# Patient Record
Sex: Female | Born: 1935 | Race: White | Hispanic: No | Smoking: Never smoker
Health system: Southern US, Community
[De-identification: ages and names within clinical notes are randomized; demographics above are authoritative.]

## PROBLEM LIST (undated history)

## (undated) DIAGNOSIS — M8580 Other specified disorders of bone density and structure, unspecified site: Secondary | ICD-10-CM

## (undated) DIAGNOSIS — I10 Essential (primary) hypertension: Secondary | ICD-10-CM

## (undated) DIAGNOSIS — E785 Hyperlipidemia, unspecified: Secondary | ICD-10-CM

## (undated) HISTORY — PX: CATARACT EXTRACTION: SUR2

## (undated) HISTORY — DX: Hyperlipidemia, unspecified: E78.5

## (undated) HISTORY — DX: Other specified disorders of bone density and structure, unspecified site: M85.80

## (undated) HISTORY — DX: Essential (primary) hypertension: I10

---

## 2001-12-04 ENCOUNTER — Encounter: Payer: Self-pay | Admitting: Family Medicine

## 2001-12-04 ENCOUNTER — Encounter: Admission: RE | Admit: 2001-12-04 | Discharge: 2001-12-04 | Payer: Self-pay | Admitting: Family Medicine

## 2003-05-15 ENCOUNTER — Other Ambulatory Visit: Admission: RE | Admit: 2003-05-15 | Discharge: 2003-05-15 | Payer: Self-pay | Admitting: Family Medicine

## 2003-08-17 ENCOUNTER — Encounter (INDEPENDENT_AMBULATORY_CARE_PROVIDER_SITE_OTHER): Payer: Self-pay | Admitting: *Deleted

## 2003-08-17 ENCOUNTER — Ambulatory Visit (HOSPITAL_COMMUNITY): Admission: RE | Admit: 2003-08-17 | Discharge: 2003-08-17 | Payer: Self-pay | Admitting: Obstetrics and Gynecology

## 2004-12-14 ENCOUNTER — Other Ambulatory Visit: Admission: RE | Admit: 2004-12-14 | Discharge: 2004-12-14 | Payer: Self-pay | Admitting: Family Medicine

## 2005-07-18 ENCOUNTER — Ambulatory Visit: Payer: Self-pay | Admitting: Family Medicine

## 2005-07-24 ENCOUNTER — Ambulatory Visit: Payer: Self-pay | Admitting: Family Medicine

## 2005-10-18 ENCOUNTER — Ambulatory Visit: Payer: Self-pay | Admitting: Family Medicine

## 2006-01-31 ENCOUNTER — Encounter (INDEPENDENT_AMBULATORY_CARE_PROVIDER_SITE_OTHER): Payer: Self-pay | Admitting: Family Medicine

## 2006-01-31 ENCOUNTER — Other Ambulatory Visit: Admission: RE | Admit: 2006-01-31 | Discharge: 2006-01-31 | Payer: Self-pay | Admitting: Family Medicine

## 2006-01-31 ENCOUNTER — Ambulatory Visit: Payer: Self-pay | Admitting: Family Medicine

## 2006-01-31 LAB — CONVERTED CEMR LAB
AST: 30 units/L (ref 0–37)
BUN: 18 mg/dL (ref 6–23)
Calcium: 9.6 mg/dL (ref 8.4–10.5)
Chloride: 100 meq/L (ref 96–112)
Creatinine, Ser: 1.1 mg/dL (ref 0.4–1.2)
GFR calc non Af Amer: 52 mL/min
Glucose, Bld: 116 mg/dL — ABNORMAL HIGH (ref 70–99)
MCV: 96.5 fL (ref 78.0–100.0)
Platelets: 257 10*3/uL (ref 150–400)
RBC: 4.59 M/uL (ref 3.87–5.11)
RDW: 12.5 % (ref 11.5–14.6)
Sodium: 139 meq/L (ref 135–145)
TSH: 5.14 microintl units/mL (ref 0.35–5.50)
Total CHOL/HDL Ratio: 2.5
VLDL: 46 mg/dL — ABNORMAL HIGH (ref 0–40)

## 2006-02-15 ENCOUNTER — Encounter: Admission: RE | Admit: 2006-02-15 | Discharge: 2006-02-15 | Payer: Self-pay | Admitting: Family Medicine

## 2006-02-15 ENCOUNTER — Encounter: Payer: Self-pay | Admitting: Family Medicine

## 2006-03-01 ENCOUNTER — Encounter: Admission: RE | Admit: 2006-03-01 | Discharge: 2006-03-01 | Payer: Self-pay | Admitting: Family Medicine

## 2006-05-02 ENCOUNTER — Ambulatory Visit: Payer: Self-pay | Admitting: Family Medicine

## 2006-05-09 ENCOUNTER — Ambulatory Visit: Payer: Self-pay | Admitting: Family Medicine

## 2006-05-09 LAB — CONVERTED CEMR LAB
ALT: 20 units/L (ref 0–40)
AST: 27 units/L (ref 0–37)
BUN: 27 mg/dL — ABNORMAL HIGH (ref 6–23)
CO2: 28 meq/L (ref 19–32)
Cholesterol: 147 mg/dL (ref 0–200)
Hgb A1c MFr Bld: 6 % (ref 4.6–6.0)
Potassium: 4.1 meq/L (ref 3.5–5.1)
Sodium: 140 meq/L (ref 135–145)
Total CHOL/HDL Ratio: 2.8

## 2006-08-01 ENCOUNTER — Ambulatory Visit: Payer: Self-pay | Admitting: Family Medicine

## 2006-08-01 DIAGNOSIS — M899 Disorder of bone, unspecified: Secondary | ICD-10-CM | POA: Insufficient documentation

## 2006-08-01 DIAGNOSIS — E785 Hyperlipidemia, unspecified: Secondary | ICD-10-CM

## 2006-08-01 DIAGNOSIS — I10 Essential (primary) hypertension: Secondary | ICD-10-CM

## 2006-08-01 DIAGNOSIS — M949 Disorder of cartilage, unspecified: Secondary | ICD-10-CM

## 2006-08-01 DIAGNOSIS — M109 Gout, unspecified: Secondary | ICD-10-CM

## 2006-08-02 LAB — CONVERTED CEMR LAB
ALT: 20 units/L (ref 0–35)
BUN: 25 mg/dL — ABNORMAL HIGH (ref 6–23)
CO2: 29 meq/L (ref 19–32)
Calcium: 9.8 mg/dL (ref 8.4–10.5)
Chloride: 102 meq/L (ref 96–112)
Creatinine, Ser: 0.8 mg/dL (ref 0.4–1.2)
GFR calc non Af Amer: 75 mL/min
Glucose, Bld: 122 mg/dL — ABNORMAL HIGH (ref 70–99)
Sodium: 141 meq/L (ref 135–145)

## 2006-08-03 ENCOUNTER — Telehealth (INDEPENDENT_AMBULATORY_CARE_PROVIDER_SITE_OTHER): Payer: Self-pay | Admitting: *Deleted

## 2006-09-12 ENCOUNTER — Encounter: Admission: RE | Admit: 2006-09-12 | Discharge: 2006-09-12 | Payer: Self-pay | Admitting: Family Medicine

## 2006-11-30 ENCOUNTER — Ambulatory Visit: Payer: Self-pay | Admitting: Family Medicine

## 2006-12-04 LAB — CONVERTED CEMR LAB
CO2: 28 meq/L (ref 19–32)
Chloride: 103 meq/L (ref 96–112)
Creatinine, Ser: 1 mg/dL (ref 0.4–1.2)
Potassium: 4.1 meq/L (ref 3.5–5.1)
Total CHOL/HDL Ratio: 2.7
Triglycerides: 278 mg/dL (ref 0–149)
VLDL: 56 mg/dL — ABNORMAL HIGH (ref 0–40)

## 2006-12-05 ENCOUNTER — Telehealth (INDEPENDENT_AMBULATORY_CARE_PROVIDER_SITE_OTHER): Payer: Self-pay | Admitting: *Deleted

## 2006-12-05 ENCOUNTER — Encounter (INDEPENDENT_AMBULATORY_CARE_PROVIDER_SITE_OTHER): Payer: Self-pay | Admitting: *Deleted

## 2007-04-01 ENCOUNTER — Ambulatory Visit: Payer: Self-pay | Admitting: Family Medicine

## 2007-04-11 LAB — CONVERTED CEMR LAB
BUN: 24 mg/dL — ABNORMAL HIGH (ref 6–23)
Creatinine, Ser: 0.9 mg/dL (ref 0.4–1.2)
GFR calc Af Amer: 78 mL/min
GFR calc non Af Amer: 65 mL/min
Hemoglobin: 14.6 g/dL (ref 12.0–15.0)
Lymphocytes Relative: 26.8 % (ref 12.0–46.0)
MCHC: 33.2 g/dL (ref 30.0–36.0)
MCV: 99.7 fL (ref 78.0–100.0)
Monocytes Absolute: 0.6 10*3/uL (ref 0.2–0.7)
Monocytes Relative: 8.4 % (ref 3.0–11.0)
Neutrophils Relative %: 60.9 % (ref 43.0–77.0)
WBC: 7 10*3/uL (ref 4.5–10.5)

## 2007-04-12 ENCOUNTER — Encounter (INDEPENDENT_AMBULATORY_CARE_PROVIDER_SITE_OTHER): Payer: Self-pay | Admitting: *Deleted

## 2007-04-17 ENCOUNTER — Ambulatory Visit: Payer: Self-pay | Admitting: Family Medicine

## 2007-04-22 LAB — CONVERTED CEMR LAB
Alkaline Phosphatase: 66 units/L (ref 39–117)
Bilirubin, Direct: 0.1 mg/dL (ref 0.0–0.3)
CO2: 29 meq/L (ref 19–32)
Calcium: 9.8 mg/dL (ref 8.4–10.5)
Cholesterol: 141 mg/dL (ref 0–200)
GFR calc Af Amer: 78 mL/min
HDL: 52.1 mg/dL (ref >39.0)
Total Bilirubin: 0.9 mg/dL (ref 0.3–1.2)
Total CHOL/HDL Ratio: 2.7
Total Protein: 7.1 g/dL (ref 6.0–8.3)
Triglycerides: 261 mg/dL (ref 0–149)

## 2007-04-29 ENCOUNTER — Telehealth (INDEPENDENT_AMBULATORY_CARE_PROVIDER_SITE_OTHER): Payer: Self-pay | Admitting: *Deleted

## 2007-05-08 ENCOUNTER — Telehealth (INDEPENDENT_AMBULATORY_CARE_PROVIDER_SITE_OTHER): Payer: Self-pay | Admitting: *Deleted

## 2007-05-09 ENCOUNTER — Ambulatory Visit: Payer: Self-pay | Admitting: Internal Medicine

## 2007-05-09 LAB — CONVERTED CEMR LAB
Basophils Absolute: 0.1 10*3/uL (ref 0.0–0.1)
Eosinophils Relative: 2.2 % (ref 0.0–5.0)
Lymphocytes Relative: 34.1 % (ref 12.0–46.0)
MCV: 98.3 fL (ref 78.0–100.0)
Monocytes Absolute: 0.8 10*3/uL (ref 0.1–1.0)
Monocytes Relative: 13.3 % — ABNORMAL HIGH (ref 3.0–12.0)
Neutro Abs: 3 10*3/uL (ref 1.4–7.7)
Neutrophils Relative %: 48.7 % (ref 43.0–77.0)
Platelets: 216 10*3/uL (ref 150–400)
RBC: 4.43 M/uL (ref 3.87–5.11)
RDW: 12.7 % (ref 11.5–14.6)
Rhuematoid fact SerPl-aCnc: <20 intl units/mL — ABNORMAL LOW (ref 0.0–20.0)
Uric Acid, Serum: 10.8 mg/dL — ABNORMAL HIGH (ref 2.4–7.0)
WBC: 6 10*3/uL (ref 4.5–10.5)

## 2007-05-15 ENCOUNTER — Encounter: Payer: Self-pay | Admitting: Internal Medicine

## 2007-05-20 ENCOUNTER — Encounter (INDEPENDENT_AMBULATORY_CARE_PROVIDER_SITE_OTHER): Payer: Self-pay | Admitting: *Deleted

## 2007-05-21 ENCOUNTER — Encounter (INDEPENDENT_AMBULATORY_CARE_PROVIDER_SITE_OTHER): Payer: Self-pay | Admitting: *Deleted

## 2007-05-21 ENCOUNTER — Encounter: Payer: Self-pay | Admitting: Internal Medicine

## 2007-05-23 ENCOUNTER — Encounter (INDEPENDENT_AMBULATORY_CARE_PROVIDER_SITE_OTHER): Payer: Self-pay | Admitting: *Deleted

## 2007-06-05 ENCOUNTER — Encounter: Admission: RE | Admit: 2007-06-05 | Discharge: 2007-06-05 | Payer: Self-pay | Admitting: Family Medicine

## 2007-06-13 ENCOUNTER — Encounter: Payer: Self-pay | Admitting: Internal Medicine

## 2007-06-17 ENCOUNTER — Encounter (INDEPENDENT_AMBULATORY_CARE_PROVIDER_SITE_OTHER): Payer: Self-pay | Admitting: *Deleted

## 2007-07-23 ENCOUNTER — Telehealth (INDEPENDENT_AMBULATORY_CARE_PROVIDER_SITE_OTHER): Payer: Self-pay | Admitting: *Deleted

## 2007-07-24 ENCOUNTER — Ambulatory Visit: Payer: Self-pay | Admitting: Family Medicine

## 2007-08-04 LAB — CONVERTED CEMR LAB
Albumin: 3.2 g/dL — ABNORMAL LOW (ref 3.5–5.2)
BUN: 46 mg/dL — ABNORMAL HIGH (ref 6–23)
Bilirubin, Direct: 0.1 mg/dL (ref 0.0–0.3)
CO2: 25 meq/L (ref 19–32)
Creatinine, Ser: 1.4 mg/dL — ABNORMAL HIGH (ref 0.4–1.2)
Potassium: 3.6 meq/L (ref 3.5–5.1)
Uric Acid, Serum: 8.3 mg/dL — ABNORMAL HIGH (ref 2.4–7.0)

## 2007-08-05 ENCOUNTER — Encounter (INDEPENDENT_AMBULATORY_CARE_PROVIDER_SITE_OTHER): Payer: Self-pay | Admitting: *Deleted

## 2007-08-06 ENCOUNTER — Encounter (INDEPENDENT_AMBULATORY_CARE_PROVIDER_SITE_OTHER): Payer: Self-pay | Admitting: *Deleted

## 2007-08-06 ENCOUNTER — Telehealth (INDEPENDENT_AMBULATORY_CARE_PROVIDER_SITE_OTHER): Payer: Self-pay | Admitting: *Deleted

## 2007-08-21 ENCOUNTER — Telehealth (INDEPENDENT_AMBULATORY_CARE_PROVIDER_SITE_OTHER): Payer: Self-pay | Admitting: *Deleted

## 2007-08-22 ENCOUNTER — Ambulatory Visit: Payer: Self-pay | Admitting: Family Medicine

## 2007-08-22 DIAGNOSIS — R609 Edema, unspecified: Secondary | ICD-10-CM | POA: Insufficient documentation

## 2007-08-28 ENCOUNTER — Telehealth: Payer: Self-pay | Admitting: Family Medicine

## 2007-08-28 LAB — CONVERTED CEMR LAB
BUN: 17 mg/dL (ref 6–23)
CO2: 29 meq/L (ref 19–32)
Calcium: 9.5 mg/dL (ref 8.4–10.5)
Creatinine, Ser: 0.7 mg/dL (ref 0.4–1.2)
Sodium: 143 meq/L (ref 135–145)

## 2007-09-02 ENCOUNTER — Ambulatory Visit: Payer: Self-pay | Admitting: Family Medicine

## 2007-09-02 LAB — CONVERTED CEMR LAB
BUN: 11 mg/dL (ref 6–23)
Calcium: 9.4 mg/dL (ref 8.4–10.5)
GFR calc Af Amer: 105 mL/min
Uric Acid, Serum: 5 mg/dL (ref 2.4–7.0)

## 2007-09-03 ENCOUNTER — Telehealth (INDEPENDENT_AMBULATORY_CARE_PROVIDER_SITE_OTHER): Payer: Self-pay | Admitting: *Deleted

## 2007-09-03 ENCOUNTER — Encounter (INDEPENDENT_AMBULATORY_CARE_PROVIDER_SITE_OTHER): Payer: Self-pay | Admitting: *Deleted

## 2007-09-04 ENCOUNTER — Telehealth: Payer: Self-pay | Admitting: Family Medicine

## 2007-09-05 ENCOUNTER — Telehealth (INDEPENDENT_AMBULATORY_CARE_PROVIDER_SITE_OTHER): Payer: Self-pay | Admitting: *Deleted

## 2007-09-05 ENCOUNTER — Encounter: Payer: Self-pay | Admitting: Family Medicine

## 2007-09-17 ENCOUNTER — Encounter: Payer: Self-pay | Admitting: Family Medicine

## 2007-09-24 ENCOUNTER — Encounter: Payer: Self-pay | Admitting: Family Medicine

## 2007-09-30 ENCOUNTER — Ambulatory Visit: Payer: Self-pay | Admitting: Family Medicine

## 2007-09-30 DIAGNOSIS — E876 Hypokalemia: Secondary | ICD-10-CM

## 2007-10-01 ENCOUNTER — Telehealth (INDEPENDENT_AMBULATORY_CARE_PROVIDER_SITE_OTHER): Payer: Self-pay | Admitting: *Deleted

## 2007-10-07 ENCOUNTER — Encounter (INDEPENDENT_AMBULATORY_CARE_PROVIDER_SITE_OTHER): Payer: Self-pay | Admitting: *Deleted

## 2007-10-07 LAB — CONVERTED CEMR LAB
BUN: 14 mg/dL (ref 6–23)
CO2: 28 meq/L (ref 19–32)
Calcium: 9.4 mg/dL (ref 8.4–10.5)
GFR calc Af Amer: 90 mL/min
Glucose, Bld: 125 mg/dL — ABNORMAL HIGH (ref 70–99)
Sodium: 144 meq/L (ref 135–145)

## 2007-10-09 ENCOUNTER — Telehealth (INDEPENDENT_AMBULATORY_CARE_PROVIDER_SITE_OTHER): Payer: Self-pay | Admitting: *Deleted

## 2007-10-14 ENCOUNTER — Telehealth (INDEPENDENT_AMBULATORY_CARE_PROVIDER_SITE_OTHER): Payer: Self-pay | Admitting: *Deleted

## 2007-10-21 ENCOUNTER — Ambulatory Visit: Payer: Self-pay | Admitting: Family Medicine

## 2007-10-27 LAB — CONVERTED CEMR LAB
AST: 28 units/L (ref 0–37)
Albumin: 3.9 g/dL (ref 3.5–5.2)
Alkaline Phosphatase: 76 units/L (ref 39–117)
BUN: 12 mg/dL (ref 6–23)
CO2: 27 meq/L (ref 19–32)
Calcium: 9.4 mg/dL (ref 8.4–10.5)
Chloride: 105 meq/L (ref 96–112)
Cholesterol: 116 mg/dL (ref 0–200)
GFR calc Af Amer: 90 mL/min
GFR calc non Af Amer: 74 mL/min
Glucose, Bld: 122 mg/dL — ABNORMAL HIGH (ref 70–99)
HDL: 38.9 mg/dL — ABNORMAL LOW (ref >39.0)
Potassium: 4 meq/L (ref 3.5–5.1)
Total CHOL/HDL Ratio: 3
Total Protein: 6.5 g/dL (ref 6.0–8.3)
Triglycerides: 132 mg/dL (ref 0–149)

## 2007-10-28 ENCOUNTER — Encounter (INDEPENDENT_AMBULATORY_CARE_PROVIDER_SITE_OTHER): Payer: Self-pay | Admitting: *Deleted

## 2007-10-30 ENCOUNTER — Telehealth (INDEPENDENT_AMBULATORY_CARE_PROVIDER_SITE_OTHER): Payer: Self-pay | Admitting: *Deleted

## 2008-01-06 ENCOUNTER — Telehealth (INDEPENDENT_AMBULATORY_CARE_PROVIDER_SITE_OTHER): Payer: Self-pay | Admitting: *Deleted

## 2008-10-26 ENCOUNTER — Telehealth (INDEPENDENT_AMBULATORY_CARE_PROVIDER_SITE_OTHER): Payer: Self-pay | Admitting: *Deleted

## 2008-10-26 ENCOUNTER — Encounter (INDEPENDENT_AMBULATORY_CARE_PROVIDER_SITE_OTHER): Payer: Self-pay | Admitting: *Deleted

## 2008-10-28 ENCOUNTER — Ambulatory Visit: Payer: Self-pay | Admitting: Family Medicine

## 2008-10-28 DIAGNOSIS — Z780 Asymptomatic menopausal state: Secondary | ICD-10-CM | POA: Insufficient documentation

## 2008-12-01 ENCOUNTER — Ambulatory Visit: Payer: Self-pay | Admitting: Family Medicine

## 2008-12-01 LAB — CONVERTED CEMR LAB
Blood in Urine, dipstick: NEGATIVE
WBC Urine, dipstick: NEGATIVE
pH: 6.5

## 2008-12-09 ENCOUNTER — Telehealth (INDEPENDENT_AMBULATORY_CARE_PROVIDER_SITE_OTHER): Payer: Self-pay | Admitting: *Deleted

## 2008-12-22 LAB — CONVERTED CEMR LAB
ALT: 15 units/L (ref 0–35)
Albumin: 3.7 g/dL (ref 3.5–5.2)
Alkaline Phosphatase: 87 units/L (ref 39–117)
BUN: 15 mg/dL (ref 6–23)
Bilirubin, Direct: 0.1 mg/dL (ref 0.0–0.3)
CO2: 29 meq/L (ref 19–32)
Calcium: 8.9 mg/dL (ref 8.4–10.5)
Chloride: 104 meq/L (ref 96–112)
Eosinophils Relative: 1.2 % (ref 0.0–5.0)
GFR calc non Af Amer: 73.95 mL/min (ref >60)
Glucose, Bld: 166 mg/dL — ABNORMAL HIGH (ref 70–99)
HCT: 40.7 % (ref 36.0–46.0)
HDL: 49 mg/dL (ref >39.00)
LDL Cholesterol: 51 mg/dL (ref 0–99)
Lymphocytes Relative: 16.7 % (ref 12.0–46.0)
Lymphs Abs: 1.8 10*3/uL (ref 0.7–4.0)
Monocytes Relative: 5.5 % (ref 3.0–12.0)
Neutro Abs: 7.8 10*3/uL — ABNORMAL HIGH (ref 1.4–7.7)
Total CHOL/HDL Ratio: 3
Uric Acid, Serum: 5.4 mg/dL (ref 2.4–7.0)
VLDL: 39.4 mg/dL (ref 0.0–40.0)

## 2008-12-29 ENCOUNTER — Telehealth: Payer: Self-pay | Admitting: Family Medicine

## 2009-04-08 ENCOUNTER — Ambulatory Visit: Payer: Self-pay | Admitting: Family Medicine

## 2009-04-08 LAB — CONVERTED CEMR LAB
AST: 22 units/L (ref 0–37)
Albumin: 4.1 g/dL (ref 3.5–5.2)
Alkaline Phosphatase: 58 units/L (ref 39–117)
BUN: 21 mg/dL (ref 6–23)
Cholesterol: 136 mg/dL (ref 0–200)
GFR calc non Af Amer: 64.49 mL/min (ref >60)
HDL: 50.3 mg/dL (ref >39.00)
LDL Cholesterol: 62 mg/dL (ref 0–99)
Potassium: 4.2 meq/L (ref 3.5–5.1)
Total CHOL/HDL Ratio: 3
Total Protein: 7.3 g/dL (ref 6.0–8.3)
Triglycerides: 119 mg/dL (ref 0.0–149.0)
Uric Acid, Serum: 3.9 mg/dL (ref 2.4–7.0)
VLDL: 23.8 mg/dL (ref 0.0–40.0)

## 2009-04-11 LAB — CONVERTED CEMR LAB: Vit D, 25-Hydroxy: 12 ng/mL — ABNORMAL LOW (ref 30–89)

## 2009-04-14 ENCOUNTER — Encounter: Payer: Self-pay | Admitting: Family Medicine

## 2009-04-14 ENCOUNTER — Encounter: Admission: RE | Admit: 2009-04-14 | Discharge: 2009-04-14 | Payer: Self-pay | Admitting: Family Medicine

## 2009-04-19 ENCOUNTER — Telehealth: Payer: Self-pay | Admitting: Family Medicine

## 2009-10-07 ENCOUNTER — Encounter (INDEPENDENT_AMBULATORY_CARE_PROVIDER_SITE_OTHER): Payer: Self-pay | Admitting: *Deleted

## 2009-12-28 ENCOUNTER — Encounter: Payer: Self-pay | Admitting: Family Medicine

## 2010-01-19 ENCOUNTER — Ambulatory Visit
Admission: RE | Admit: 2010-01-19 | Discharge: 2010-01-19 | Payer: Self-pay | Source: Home / Self Care | Attending: Family Medicine | Admitting: Family Medicine

## 2010-01-19 ENCOUNTER — Other Ambulatory Visit: Payer: Self-pay | Admitting: Family Medicine

## 2010-01-19 ENCOUNTER — Encounter: Payer: Self-pay | Admitting: Family Medicine

## 2010-01-19 LAB — LIPID PANEL
Cholesterol: 178 mg/dL (ref 0–200)
HDL: 56 mg/dL (ref >39.00)
Total CHOL/HDL Ratio: 3
Triglycerides: 218 mg/dL — ABNORMAL HIGH (ref 0.0–149.0)
VLDL: 43.6 mg/dL — ABNORMAL HIGH (ref 0.0–40.0)

## 2010-01-19 LAB — HEPATIC FUNCTION PANEL
ALT: 13 U/L (ref 0–35)
AST: 25 U/L (ref 0–37)
Albumin: 4 g/dL (ref 3.5–5.2)
Alkaline Phosphatase: 56 U/L (ref 39–117)
Bilirubin, Direct: 0.2 mg/dL (ref 0.0–0.3)
Total Bilirubin: 0.9 mg/dL (ref 0.3–1.2)
Total Protein: 7 g/dL (ref 6.0–8.3)

## 2010-01-19 LAB — LDL CHOLESTEROL, DIRECT: Direct LDL: 97.7 mg/dL

## 2010-01-19 LAB — BASIC METABOLIC PANEL
BUN: 22 mg/dL (ref 6–23)
CO2: 26 mEq/L (ref 19–32)
Calcium: 9.5 mg/dL (ref 8.4–10.5)
Chloride: 104 mEq/L (ref 96–112)
Creatinine, Ser: 0.9 mg/dL (ref 0.4–1.2)
GFR: 66.05 mL/min (ref >60.00)
Glucose, Bld: 101 mg/dL — ABNORMAL HIGH (ref 70–99)
Potassium: 4.3 mEq/L (ref 3.5–5.1)
Sodium: 141 mEq/L (ref 135–145)

## 2010-01-19 LAB — URIC ACID: Uric Acid, Serum: 3.4 mg/dL (ref 2.4–7.0)

## 2010-01-25 ENCOUNTER — Telehealth (INDEPENDENT_AMBULATORY_CARE_PROVIDER_SITE_OTHER): Payer: Self-pay | Admitting: *Deleted

## 2010-01-25 LAB — CONVERTED CEMR LAB: Vit D, 25-Hydroxy: 16 ng/mL — ABNORMAL LOW (ref 30–89)

## 2010-02-08 NOTE — Miscellaneous (Signed)
  Clinical Lists Changes  Observations: Added new observation of FLU VAX: given at [facility] (10/07/2009 11:11)      Immunization History:  Influenza Immunization History:    Influenza:  given at [facility] (10/07/2009)

## 2010-02-08 NOTE — Progress Notes (Signed)
Summary: Bone density   Phone Note Outgoing Call   Call placed by: Army Fossa CMA,  April 19, 2009 8:50 AM Summary of Call: Regaridng Bone Density results:  + osteoporosis------  pt with hx Genella Rife---  can consider reclast or prolia explain both to pt and she can decide Signed by Loreen Freud DO on 04/15/2009 at 9:42 PM   Follow-up for Phone Call        I explained both to pt, at first she refused doing either. But she states now she will think it about it and call me back and let me know what she has decided. Army Fossa CMA  April 19, 2009 8:53 AM

## 2010-02-08 NOTE — Assessment & Plan Note (Signed)
Summary: FU ON MEDS/[facility]   Vital Signs:  Patient profile:   77 year old female Weight:      137 pounds Pulse rate:   65 / minute Pulse rhythm:   regular BP sitting:   130 / 82  (left arm) Cuff size:   large  Vitals Entered By: Army Fossa CMA (April 08, 2009 8:19 AM) CC: Pt here to f/u on bloodwork, pt is fasting. Needs refills on- fenofibrate,allopurionol, simvastatin- 90 days if possible traveling to [place].    History of Present Illness:  Hyperlipidemia follow-up      This is a 77 year old woman who presents for Hyperlipidemia follow-up.  The patient denies muscle aches, GI upset, abdominal pain, flushing, itching, constipation, diarrhea, and fatigue.  The patient denies the following symptoms: chest pain/pressure, exercise intolerance, dypsnea, palpitations, syncope, and pedal edema.  Compliance with medications (by patient report) has been near 100%.  Dietary compliance has been good.  The patient reports exercising daily.  Adjunctive measures currently used by the patient include ASA.    Hypertension follow-up      The patient also presents for Hypertension follow-up.  The patient denies lightheadedness, urinary frequency, headaches, edema, impotence, rash, and fatigue.  The patient denies the following associated symptoms: chest pain, chest pressure, exercise intolerance, dyspnea, palpitations, syncope, leg edema, and pedal edema.  Compliance with medications (by patient report) has been near 100%.  The patient reports that dietary compliance has been good.  The patient reports exercising daily.  Adjunctive measures currently used by the patient include salt restriction.    Preventive Screening-Counseling & Management  Alcohol-Tobacco     Alcohol drinks/day: <1     Smoking Status: never  Caffeine-Diet-Exercise     Caffeine use/day: 2     Does Patient Exercise: yes     Type of exercise: walking   Current Medications (verified): 1)  Colchicine 0.6 Mg Tabs (Colchicine)  .... Take 1 Tablet By Mouth Every 2 Hours Until Gout Gone X 24 Hrs ;then 1 Once Daily Thereafter 2)  Zocor 80 Mg Tabs (Simvastatin) .Marland Kitchen.. 1 By Mouth At Bedtime. 3)  Metoprolol Tartrate 100 Mg Tabs (Metoprolol Tartrate) .... Take One Tablet Twice Daily 4)  Baby Aspirin 81 Mg  Chew (Aspirin) 5)  Fish Oil 6)  Allopurinol 100 Mg  Tabs (Allopurinol) .... 2 By Mouth Once Daily 7)  Omeprazole 20 Mg Cpdr (Omeprazole) .Marland Kitchen.. 1 By Mouth Daily. 8)  Zostavax 35573 Unt/0.37ml Solr (Zoster Vaccine Live) .Marland Kitchen.. 1 Ml Im X1 9)  Fenofibrate 160 Mg Tabs (Fenofibrate) .Marland Kitchen.. 1 By Mouth Once Daily  Allergies (verified): No Known Drug Allergies  Past History:  Past medical, surgical, family and social histories (including risk factors) reviewed for relevance to current acute and chronic problems.  Past Medical History: Reviewed history from 08/01/2006 and no changes required. Hyperlipidemia Hypertension Osteopenia Gout: elevated uric acid, but patient declined daily therapy.  Past Surgical History: Reviewed history from 10/28/2008 and no changes required. Cataract extraction  01/2008, 03/2008  Family History: Reviewed history and no changes required. F--Parkinsons Family History of Stroke M 1st degree relative 75 yo   Social History: Reviewed history from 08/01/2006 and no changes required. Retired Never Smoked Alcohol use-yes Drug use-no Regular exercise-yes widowed   Review of Systems      See HPI  Physical Exam  General:  Well-developed,well-nourished,in no acute distress; alert,appropriate and cooperative throughout examination Lungs:  Normal respiratory effort, chest expands symmetrically. Lungs are clear to auscultation, no crackles or  wheezes. Heart:  normal rate and no murmur.   Extremities:  No clubbing, cyanosis, edema, or deformity noted with normal full range of motion of all joints.   Psych:  Oriented X3.     Impression & Recommendations:  Problem # 1:  HYPERTENSION  (ICD-401.9)  Her updated medication list for this problem includes:    Metoprolol Tartrate 100 Mg Tabs (Metoprolol tartrate) .Marland Kitchen... Take one tablet twice daily  Orders: Venipuncture (23762) TLB-Lipid Panel (80061-LIPID) TLB-BMP (Basic Metabolic Panel-BMET) (80048-METABOL) TLB-Hepatic/Liver Function Pnl (80076-HEPATIC) TLB-Uric Acid, Blood (84550-URIC) T-Vitamin D (25-Hydroxy) (83151-76160)  BP today: 130/82 Prior BP: 130/80 (10/28/2008)  Labs Reviewed: K+: 3.8 (12/01/2008) Creat: : 0.8 (12/01/2008)   Chol: 139 (12/01/2008)   HDL: 49.00 (12/01/2008)   LDL: 51 (12/01/2008)   TG: 197.0 (12/01/2008)  Problem # 2:  HYPERLIPIDEMIA (ICD-272.4)  Her updated medication list for this problem includes:    Zocor 80 Mg Tabs (Simvastatin) .Marland Kitchen... 1 by mouth at bedtime.    Fenofibrate 160 Mg Tabs (Fenofibrate) .Marland Kitchen... 1 by mouth once daily  Orders: Venipuncture (73710) TLB-Lipid Panel (80061-LIPID) TLB-BMP (Basic Metabolic Panel-BMET) (80048-METABOL) TLB-Hepatic/Liver Function Pnl (80076-HEPATIC) TLB-Uric Acid, Blood (84550-URIC) T-Vitamin D (25-Hydroxy) (62694-85462)  Labs Reviewed: SGOT: 18 (12/01/2008)   SGPT: 15 (12/01/2008)   HDL:49.00 (12/01/2008), 38.9 (10/21/2007)  LDL:51 (12/01/2008), 51 (10/21/2007)  Chol:139 (12/01/2008), 116 (10/21/2007)  Trig:197.0 (12/01/2008), 132 (10/21/2007)  Problem # 3:  GOUT (ICD-274.9)  Her updated medication list for this problem includes:    Colchicine 0.6 Mg Tabs (Colchicine) .Marland Kitchen... Take 1 tablet by mouth every 2 hours until gout gone x 24 hrs ;then 1 once daily thereafter    Allopurinol 100 Mg Tabs (Allopurinol) .Marland Kitchen... 2 by mouth once daily  Orders: Venipuncture (70350) TLB-Lipid Panel (80061-LIPID) TLB-BMP (Basic Metabolic Panel-BMET) (80048-METABOL) TLB-Hepatic/Liver Function Pnl (80076-HEPATIC) TLB-Uric Acid, Blood (84550-URIC) T-Vitamin D (25-Hydroxy) (09381-82993)  Elevate extremity; warm compresses, symptomatic relief and medication as  directed.   Problem # 4:  OSTEOPENIA (ICD-733.90)  Orders: Venipuncture (71696) TLB-Lipid Panel (80061-LIPID) TLB-BMP (Basic Metabolic Panel-BMET) (80048-METABOL) TLB-Hepatic/Liver Function Pnl (80076-HEPATIC) TLB-Uric Acid, Blood (84550-URIC) T-Vitamin D (25-Hydroxy) (78938-10175)  Complete Medication List: 1)  Colchicine 0.6 Mg Tabs (Colchicine) .... Take 1 tablet by mouth every 2 hours until gout gone x 24 hrs ;then 1 once daily thereafter 2)  Zocor 80 Mg Tabs (Simvastatin) .Marland Kitchen.. 1 by mouth at bedtime. 3)  Metoprolol Tartrate 100 Mg Tabs (Metoprolol tartrate) .... Take one tablet twice daily 4)  Baby Aspirin 81 Mg Chew (Aspirin) 5)  Fish Oil  6)  Allopurinol 100 Mg Tabs (Allopurinol) .... 2 by mouth once daily 7)  Omeprazole 20 Mg Cpdr (Omeprazole) .Marland Kitchen.. 1 by mouth daily. 8)  Zostavax 10258 Unt/0.32ml Solr (Zoster vaccine live) .Marland Kitchen.. 1 ml im x1 9)  Fenofibrate 160 Mg Tabs (Fenofibrate) .Marland Kitchen.. 1 by mouth once daily  Other Orders: Radiology Referral (Radiology) Radiology Referral (Radiology) Prescriptions: METOPROLOL TARTRATE 100 MG TABS (METOPROLOL TARTRATE) Take one tablet twice daily  #180 Each x 3   Entered and Authorized by:   Loreen Freud DO   Signed by:   Loreen Freud DO on 04/08/2009   Method used:   Electronically to        [facility] [address] (retail)       [phone]       [place], [place]  [address]       Ph: [phone]       Fax: [phone]  RxID:   [id number]   Colonoscopy Next Due:  Refused

## 2010-02-10 NOTE — Assessment & Plan Note (Signed)
Summary: refill meds//going out of town//lch   Vital Signs:  Patient profile:   77 year old female Height:      59 inches Weight:      140.2 pounds BMI:     28.42 Pulse rate:   56 / minute Pulse rhythm:   regular BP sitting:   136 / 82  (left arm) Cuff size:   regular  Vitals Entered By: Almeta Monas CMA Duncan Dull) (January 19, 2010 10:35 AM) CC: med refill--going to [place] for a few mos and would like a 90 days supply of meds and Rx for zostavax   History of Present Illness:  Hypertension follow-up      This is a 77 year old woman who presents for Hypertension follow-up.  The patient denies lightheadedness, urinary frequency, headaches, edema, impotence, rash, and fatigue.  The patient denies the following associated symptoms: chest pain, chest pressure, exercise intolerance, dyspnea, palpitations, syncope, leg edema, and pedal edema.  Compliance with medications (by patient report) has been near 100%.  The patient reports that dietary compliance has been good.  The patient reports exercising daily.  Adjunctive measures currently used by the patient include salt restriction.    Hyperlipidemia follow-up      The patient also presents for Hyperlipidemia follow-up.  The patient denies muscle aches, GI upset, abdominal pain, flushing, itching, constipation, diarrhea, and fatigue.  The patient denies the following symptoms: chest pain/pressure, exercise intolerance, dypsnea, palpitations, syncope, and pedal edema.  Compliance with medications (by patient report) has been near 100%.  Dietary compliance has been good.  The patient reports exercising daily.  Adjunctive measures currently used by the patient include ASA.    Current Medications (verified): 1)  Colchicine 0.6 Mg Tabs (Colchicine) .... Take 1 Tablet By Mouth Every 2 Hours Until Gout Gone X 24 Hrs ;then 1 Once Daily Thereafter 2)  Zocor 80 Mg Tabs (Simvastatin) .Marland Kitchen.. 1 By Mouth At Bedtime. 3)  Metoprolol Tartrate 100 Mg Tabs  (Metoprolol Tartrate) .... Take One Tablet Twice Daily 4)  Baby Aspirin 81 Mg  Chew (Aspirin) 5)  Fish Oil 6)  Allopurinol 100 Mg  Tabs (Allopurinol) .... 2 By Mouth Once Daily 7)  Omeprazole 20 Mg Cpdr (Omeprazole) .Marland Kitchen.. 1 By Mouth Daily. 8)  Zostavax 16109 Unt/0.32ml Solr (Zoster Vaccine Live) .Marland Kitchen.. 1 Ml Im X1 9)  Fenofibrate 160 Mg Tabs (Fenofibrate) .Marland Kitchen.. 1 By Mouth Once Daily  Allergies (verified): No Known Drug Allergies  Physical Exam  General:  Well-developed,well-nourished,in no acute distress; alert,appropriate and cooperative throughout examination Neck:  No deformities, masses, or tenderness noted. Lungs:  Normal respiratory effort, chest expands symmetrically. Lungs are clear to auscultation, no crackles or wheezes. Heart:  normal rate and no murmur.   Extremities:  No clubbing, cyanosis, edema, or deformity noted with normal full range of motion of all joints.   Psych:  Oriented X3 and normally interactive.     Impression & Recommendations:  Problem # 1:  GOUT (ICD-274.9)  Her updated medication list for this problem includes:    Colchicine 0.6 Mg Tabs (Colchicine) .Marland Kitchen... Take 1 tablet by mouth every 2 hours until gout gone x 24 hrs ;then 1 once daily thereafter    Allopurinol 100 Mg Tabs (Allopurinol) .Marland Kitchen... 2 by mouth once daily  Orders: Venipuncture (60454) TLB-Lipid Panel (80061-LIPID) TLB-BMP (Basic Metabolic Panel-BMET) (80048-METABOL) TLB-Hepatic/Liver Function Pnl (80076-HEPATIC) TLB-Uric Acid, Blood (84550-URIC) T-Vitamin D (25-Hydroxy) (09811-91478)  Elevate extremity; warm compresses, symptomatic relief and medication as directed.   Problem #  2:  HYPERTENSION (ICD-401.9)  Her updated medication list for this problem includes:    Metoprolol Tartrate 100 Mg Tabs (Metoprolol tartrate) .Marland Kitchen... Take one tablet twice daily  Orders: Venipuncture (60454) TLB-Lipid Panel (80061-LIPID) TLB-BMP (Basic Metabolic Panel-BMET) (80048-METABOL) TLB-Hepatic/Liver  Function Pnl (80076-HEPATIC) TLB-Uric Acid, Blood (84550-URIC) T-Vitamin D (25-Hydroxy) (09811-91478)  BP today: 136/82 Prior BP: 130/82 (04/08/2009)  Labs Reviewed: K+: 4.2 (04/08/2009) Creat: : 0.9 (04/08/2009)   Chol: 136 (04/08/2009)   HDL: 50.30 (04/08/2009)   LDL: 62 (04/08/2009)   TG: 119.0 (04/08/2009)  Problem # 3:  HYPERLIPIDEMIA (ICD-272.4)  Her updated medication list for this problem includes:    Zocor 80 Mg Tabs (Simvastatin) .Marland Kitchen... 1 by mouth at bedtime.    Fenofibrate 160 Mg Tabs (Fenofibrate) .Marland Kitchen... 1 by mouth once daily  Orders: Venipuncture (29562) TLB-Lipid Panel (80061-LIPID) TLB-BMP (Basic Metabolic Panel-BMET) (80048-METABOL) TLB-Hepatic/Liver Function Pnl (80076-HEPATIC) TLB-Uric Acid, Blood (84550-URIC) T-Vitamin D (25-Hydroxy) (13086-57846)  Labs Reviewed: SGOT: 22 (04/08/2009)   SGPT: 13 (04/08/2009)   HDL:50.30 (04/08/2009), 49.00 (12/01/2008)  LDL:62 (04/08/2009), 51 (12/01/2008)  Chol:136 (04/08/2009), 139 (12/01/2008)  Trig:119.0 (04/08/2009), 197.0 (12/01/2008)  Problem # 4:  POSTMENOPAUSAL STATUS (ICD-V49.81)  Orders: Venipuncture (96295) TLB-Lipid Panel (80061-LIPID) TLB-BMP (Basic Metabolic Panel-BMET) (80048-METABOL) TLB-Hepatic/Liver Function Pnl (80076-HEPATIC) TLB-Uric Acid, Blood (84550-URIC) T-Vitamin D (25-Hydroxy) (28413-24401)  Complete Medication List: 1)  Colchicine 0.6 Mg Tabs (Colchicine) .... Take 1 tablet by mouth every 2 hours until gout gone x 24 hrs ;then 1 once daily thereafter 2)  Zocor 80 Mg Tabs (Simvastatin) .Marland Kitchen.. 1 by mouth at bedtime. 3)  Metoprolol Tartrate 100 Mg Tabs (Metoprolol tartrate) .... Take one tablet twice daily 4)  Baby Aspirin 81 Mg Chew (Aspirin) 5)  Fish Oil  6)  Allopurinol 100 Mg Tabs (Allopurinol) .... 2 by mouth once daily 7)  Omeprazole 20 Mg Cpdr (Omeprazole) .Marland Kitchen.. 1 by mouth daily. 8)  Zostavax 02725 Unt/0.12ml Solr (Zoster vaccine live) .Marland Kitchen.. 1 ml im x1 9)  Fenofibrate 160 Mg Tabs  (Fenofibrate) .Marland Kitchen.. 1 by mouth once daily  Patient Instructions: 1)  Please schedule a follow-up appointment in 6 months .  Prescriptions: FENOFIBRATE 160 MG TABS (FENOFIBRATE) 1 by mouth once daily  #90 x 3   Entered and Authorized by:   Loreen Freud DO   Signed by:   Loreen Freud DO on 01/19/2010   Method used:   Electronically to        [facility] [address] (retail)       [phone]       [place], [place]  [address]       Ph: [phone]       Fax: [phone]   RxID:   [id number] ALLOPURINOL 100 MG  TABS (ALLOPURINOL) 2 by mouth once daily  #60 Each x 5   Entered and Authorized by:   Loreen Freud DO   Signed by:   Loreen Freud DO on 01/19/2010   Method used:   Electronically to        [facility] [address] (retail)       [phone]       [place], [place]  [address]       Ph: [phone]       Fax: [phone]   RxID:   [id number] ZOCOR 80 MG TABS (SIMVASTATIN) 1 by mouth at bedtime.  #90 x 3   Entered and Authorized by:  Loreen Freud DO   Signed by:   Loreen Freud DO on 01/19/2010   Method used:   Electronically to        [facility] [address] (retail)       [phone]       [place], [place]  [address]       Ph: [phone]       Fax: [phone]   RxID:   [id number]    Orders Added: 1)  Venipuncture [95638] 2)  TLB-Lipid Panel [80061-LIPID] 3)  TLB-BMP (Basic Metabolic Panel-BMET) [80048-METABOL] 4)  TLB-Hepatic/Liver Function Pnl [80076-HEPATIC] 5)  TLB-Uric Acid, Blood [84550-URIC] 6)  T-Vitamin D (25-Hydroxy) [75643-32951] 7)  Est. Patient Level III [88416]  Appended Document: refill meds//going out of town//lch

## 2010-02-10 NOTE — Progress Notes (Signed)
Summary: PRIOR AUTH APPROVED ALLOPURINOL Damita Lack  Phone Note Refill Request Call back at [phone] Message from:  Pharmacy on January 25, 2010 11:36 AM  Refills Requested: Medication #1:  ALLOPURINOL 100 MG  TABS 2 by mouth once daily   Dosage confirmed as above?Dosage Confirmed   Supply Requested: 1 month [facility] [address] PRIOR AUTH: [phone]  Next Appointment Scheduled: [facility] Initial call taken by: Lavell Islam,  January 25, 2010 11:36 AM  Follow-up for Phone Call        awaiting fax.........Marland KitchenFelecia Deloach CMA  January 25, 2010 4:17 PM   Faxed awaiting response....Marland KitchenMarland KitchenFelecia Deloach CMA  January 26, 2010 9:09 AM   Prior auth approved 01-26-10 to 01-09-11. Pharmacy faxed.Marland KitchenMarland KitchenFelecia Deloach CMA  January 31, 2010 1:13 PM

## 2010-02-16 NOTE — Medication Information (Signed)
Summary: Prior Authorization & Approval for Allopurinol/[facility]  Prior Authorization & Approval for Allopurinol/[facility]   Imported By: Lanelle Bal 02/09/2010 08:58:28  _____________________________________________________________________  External Attachment:    Type:   Image     Comment:   External Document

## 2010-04-21 ENCOUNTER — Other Ambulatory Visit: Payer: Self-pay | Admitting: Family Medicine

## 2010-04-22 ENCOUNTER — Other Ambulatory Visit: Payer: Self-pay | Admitting: Family Medicine

## 2010-05-27 NOTE — Op Note (Signed)
NAME:  Julie [name], Julie [name]                           ACCOUNT NO.:  [id number]   MEDICAL RECORD NO.:  [id number]                   PATIENT TYPE:  AMB   LOCATION:  SDC                                  FACILITY:  [facility]   PHYSICIAN:  Charles A. Sydnee Cabal, MD            DATE OF BIRTH:  September 13, 1929   DATE OF PROCEDURE:  08/17/2003  DATE OF DISCHARGE:                                 OPERATIVE REPORT   PREOPERATIVE DIAGNOSIS:  Atypical glandular cells on Pap smear.   POSTOPERATIVE DIAGNOSIS:  Atypical glandular cells on Pap smear.   PROCEDURE:  1. Paracervical block.  2. Dilatation and curettage.  3. Cold knife conization.   SURGEON:  Charles A. Sydnee Cabal, M.D.   COMPLICATIONS:  None.   ESTIMATED BLOOD LOSS:  Less than 50 mL.   SPECIMENS:  1. Endometrial curettings.  2. Endocervical curettings.  3. Cold knife conization at 3 o'clock.  4. __________ products x2.   OPERATIVE FINDINGS:  Normal size uterus sounded to 8 cm.  Minimal specimen  on the curettings.   ANESTHESIA:  Monitored anesthesia care with paracervical block with IV  sedation.   DESCRIPTION OF PROCEDURE:  The patient was taken to the operating room and  placed in the supine position.  Anesthesia was induced.  A dorsal lithotomy  position was then placed in the universal stirrups.  Sterile prep and drape  was undertaken.  Weighted speculum was placed in the vagina.  The anterior  lip of the cervix was grasped with a single-tooth tenaculum and speculum was  used.  Paracervical block was placed at 4 and 8 o'clock.  Approximately 18  mL of 0.25% plain Marcaine was injected, divided equally between these  sites, and Hegar and Pratt dilators were used to dilate up enough to pass  the small banjo curet and the serrated curet.  The serrated curet was used  to achieve endocervical curetting.  The small banjo curet was used to curet  the anterior surface of the uterine cavity.  A small amount of tissue was  obtained with each  specimen.  There was no evidence of perforation.  Cold  knife conization was undertaken.  Stay sutures at 3 and 9 o'clock were  placed with 0 Vicryl and held.  The suture pull through was done at the 3  o'clock location.  This was overlayed suture with running locking 0 Vicryl  with good hemostasis.  The 11 blade knife was then used to excise the large  cone of specimen up in the _________ 1-2 cm .  This was opened to 3 o'clock.  The cone bed was cauterized with electrocautery.  Hemostasis was  adequate.  The cervical mucosal edge and cone bed was closed with running  locking 0 Vicryl with good hemostasis resulting.  Visualization was carried  out to verify hemostasis.  Hemostasis was adequate.  All instruments were  removed.  The patient was taken  to the recovery room in stable condition.  He tolerated the procedure well.                                               Charles A. Sydnee Cabal, MD    CAD/MEDQ  D:  08/17/2003  T:  08/17/2003  Job:  [id number]

## 2010-05-27 NOTE — H&P (Signed)
NAME:  Julie [name], Julie [name]                           ACCOUNT NO.:  [id number]   MEDICAL RECORD NO.:  [id number]                   PATIENT TYPE:  AMB   LOCATION:  SDC                                  FACILITY:  [facility]   PHYSICIAN:  Charles A. Sydnee Cabal, MD            DATE OF BIRTH:  1929/04/06   DATE OF ADMISSION:  DATE OF DISCHARGE:                                HISTORY & PHYSICAL   CHIEF COMPLAINT:  Atypical glandular cells on Pap smear.   HISTORY OF PRESENT ILLNESS:  [age]-year-old para 2-0-0-2.  Patient had  originally referred from Dr. Leanne Chang, [place] at [facility], for  abnormal Pap smear; Pap smear showed atypical glandular cells favoring an  endometrial cell origin.  Endometrial biopsy showed proliferative-type  endometrium, ECC was benign, colposcopy was without lesions seen.  She is  now to be admitted for definitive D&C and cold-knife conization to ensure  clear tissue specimen of endocervix and endometrium.  Transvaginal  ultrasound did show endometrium of 6.2 mm, inhomogeneous, otherwise,  appeared normal.  She denies vaginal bleeding.   PAST MEDICAL HISTORY:  1. Hypertension.  2. Hypercholesterolemia.   PAST SURGICAL HISTORY:  SVD x2.   MEDICATIONS:  1. Metoprolol 50 mg a day.  2. Zocor 20 mg every day.   ALLERGIES:  No known drug allergies.   SOCIAL HISTORY:  Denies tobacco, ethanol or drug use, STD exposure in the  past.  She is widowed, not sexually active.   FAMILY HISTORY:  Father 75 with Parkinson's, deceased.  Mother 55 with CVA,  deceased.  Sister 65, sister 36 and brother 66, brother 52, all without  major illnesses.  Denies family history of breast, uterus, cervix or colon  cancer, lymphoma, coronary artery disease, stroke, diabetes or hypertension  otherwise.   REVIEW OF SYSTEMS:  Denies fever, chills, nausea, vomiting, diarrhea,  constipation, melena or hematochezia, skin lesions, rashes, headaches or  dizziness.  She does have occasional  seasonal allergies and no chest pain,  shortness of breath, wheezing or orthopnea.  No urgency, frequency,  incontinence or hematuria.  No breast complaints.  No emotional changes.   PHYSICAL EXAM:  GENERAL:  Alert and oriented x3, in no distress.  VITAL SIGNS:  Respirations 18, pulse 74, blood pressure 142/88.  Weight 140  pounds.  HEENT:  Exam grossly within normal limits.  NECK:  Neck supple without thyromegaly or adenopathy.  SKIN:  No rashes.  LUNGS:  Lungs clear to auscultation bilaterally.  HEART:  Regular rate and rhythm without murmur, rub or gallop.  BREASTS:  Deferred; recent breast exam with PCP normal per history.  ABDOMEN:  Abdomen soft, flat, nontender and nondistended.  No  hepatosplenomegaly or masses noted.  LYMPH NODES:  No groin nodes palpable  PELVIC:  Normal external female genitalia.  Bartholin's, urethra and Skene  glands within normal limits.  Urethral meatus normal.  Urethra normal.  Bladder normal.  Vagina hypoestrogenic.  Cervix menopausal in  appearance,  hypoestrogenic in appearance.  Cervix mildly stenotic.  Bimanual  examination:  Uterus not enlarged.  Adnexal regions are nontender without  masses bilaterally.  Ovaries are not palpable bilaterally.  RECTAL:  Exam is not done.  External hemorrhoids are not grossly present and  anus and perineal body appear normal.   ASSESSMENT:  Atypical glandular cells appearing to be endometrial in origin.   PLAN:  The patient will be admitted to undergo D&C.  We will go ahead at  that time to be complete an evaluation for atypical glandular cells, have  her undergo cold-knife conization as we have not found the etiology with  normal endometrial biopsy to this point.  She gives informed consent and  accepts the risks including infection, bleeding, bowel and bladder damage,  blood product risks including hepatitis and HIV exposure, anesthetic risks  and all questions are answered.  Preoperatively, we will obtain a CBC.   She  did have a Chem Profile and CBC back on May 15, 2003 -- hemoglobin 15.6,  hematocrit 44.9, platelets 285,000; chemistry profile -- creatinine 1.0, BUN  18, glucose 122, sodium 140, potassium 4.0, chloride 100, CO2 29, calcium  9.7, AST 25, ALT 25, cholesterol 230, triglycerides 181, LDL 171, HDL 56;  with these results back in May, we will have her only undergo repeat Chem-7  for this preoperative evaluation.  We will get a preoperative EKG and I will  leave a chest x-ray up to anesthesia.  She will remain n.p.o. past midnight  and take medications in the morning for blood pressure with a sip of water.  All questions were answered.  She gives informed consent and we will proceed  as outlined.                                               Charles A. Sydnee Cabal, MD    CAD/MEDQ  D:  08/04/2003  T:  08/04/2003  Job:  [id number]

## 2010-09-01 ENCOUNTER — Other Ambulatory Visit: Payer: Self-pay | Admitting: Family Medicine

## 2010-10-30 ENCOUNTER — Other Ambulatory Visit: Payer: Self-pay | Admitting: Family Medicine

## 2010-11-01 ENCOUNTER — Other Ambulatory Visit: Payer: Self-pay | Admitting: Family Medicine

## 2010-11-29 ENCOUNTER — Encounter: Payer: Self-pay | Admitting: Family Medicine

## 2010-12-05 ENCOUNTER — Encounter: Payer: Self-pay | Admitting: Family Medicine

## 2010-12-05 ENCOUNTER — Ambulatory Visit (INDEPENDENT_AMBULATORY_CARE_PROVIDER_SITE_OTHER): Payer: Medicare Other | Admitting: Family Medicine

## 2010-12-05 VITALS — BP 132/86 | HR 65 | Temp 97.9°F | Wt 139.6 lb

## 2010-12-05 DIAGNOSIS — Z0000 Encounter for general adult medical examination without abnormal findings: Secondary | ICD-10-CM

## 2010-12-05 DIAGNOSIS — E785 Hyperlipidemia, unspecified: Secondary | ICD-10-CM

## 2010-12-05 DIAGNOSIS — M109 Gout, unspecified: Secondary | ICD-10-CM

## 2010-12-05 DIAGNOSIS — I10 Essential (primary) hypertension: Secondary | ICD-10-CM

## 2010-12-05 LAB — HEPATIC FUNCTION PANEL
ALT: 14 U/L (ref 0–35)
Albumin: 4.3 g/dL (ref 3.5–5.2)
Bilirubin, Direct: 0 mg/dL (ref 0.0–0.3)
Total Bilirubin: 0.8 mg/dL (ref 0.3–1.2)

## 2010-12-05 LAB — LIPID PANEL
HDL: 60 mg/dL (ref >39.00)
Total CHOL/HDL Ratio: 3

## 2010-12-05 LAB — BASIC METABOLIC PANEL
Chloride: 104 mEq/L (ref 96–112)
Potassium: 4 mEq/L (ref 3.5–5.1)

## 2010-12-05 LAB — POCT URINALYSIS DIPSTICK
Bilirubin, UA: NEGATIVE
Ketones, UA: NEGATIVE
Leukocytes, UA: NEGATIVE
Nitrite, UA: NEGATIVE
pH, UA: 5

## 2010-12-05 LAB — URIC ACID: Uric Acid, Serum: 3.6 mg/dL (ref 2.4–7.0)

## 2010-12-05 MED ORDER — ZOSTER VACCINE LIVE 19400 UNT/0.65ML ~~LOC~~ SOLR: 0.6500 mL | Freq: Once | SUBCUTANEOUS | Status: AC

## 2010-12-05 MED ORDER — ALLOPURINOL 100 MG PO TABS: 200.0000 mg | ORAL_TABLET | Freq: Every day | ORAL | Status: DC

## 2010-12-05 NOTE — Patient Instructions (Signed)

## 2010-12-05 NOTE — Progress Notes (Signed)
  Subjective:    Patient here for follow-up of elevated blood pressure.  She is exercising and is adherent to a low-salt diet.  Blood pressure is not checked at home. Cardiac symptoms: none. Patient denies: chest pain, chest pressure/discomfort, claudication, dyspnea, exertional chest pressure/discomfort, fatigue, irregular heart beat, lower extremity edema, near-syncope, orthopnea, palpitations, paroxysmal nocturnal dyspnea, syncope and tachypnea. Cardiovascular risk factors: advanced age (older than 89 for men, 11 for women), dyslipidemia and hypertension. Use of agents associated with hypertension: none. History of target organ damage: none. Pt is also here to f/u cholesterol and gout med.    The following portions of the patient's history were reviewed and updated as appropriate: allergies, current medications, past family history, past medical history, past social history, past surgical history and problem list.  Review of Systems Pertinent items are noted in HPI.     Objective:    BP 132/86  Pulse 65  Temp(Src) 97.9 F (36.6 C) (Oral)  Wt 139 lb 9.6 oz (63.322 kg)  SpO2 97% General appearance: alert, cooperative, appears stated age and no distress Lungs: clear to auscultation bilaterally Heart: regular rate and rhythm, S1, S2 normal, no murmur, click, rub or gallop Extremities: extremities normal, atraumatic, no cyanosis or edema    Assessment:    Hypertension, normal blood pressure . Evidence of target organ damage: none.   hyperlipidemia--check labs, con't meds Gout-- check labs,  Refill allopurinol Plan:    Medication: no change. Dietary sodium restriction. Regular aerobic exercise. Check blood pressures 2-3 times weekly and record. Follow up: 6 months and as needed.

## 2010-12-14 ENCOUNTER — Other Ambulatory Visit: Payer: Self-pay | Admitting: Family Medicine

## 2010-12-14 DIAGNOSIS — R7989 Other specified abnormal findings of blood chemistry: Secondary | ICD-10-CM

## 2010-12-15 ENCOUNTER — Other Ambulatory Visit (INDEPENDENT_AMBULATORY_CARE_PROVIDER_SITE_OTHER): Payer: Medicare Other

## 2010-12-15 DIAGNOSIS — R7989 Other specified abnormal findings of blood chemistry: Secondary | ICD-10-CM

## 2010-12-15 NOTE — Progress Notes (Signed)
2

## 2011-01-25 ENCOUNTER — Other Ambulatory Visit: Payer: Self-pay | Admitting: Family Medicine

## 2011-04-04 ENCOUNTER — Encounter: Payer: Self-pay | Admitting: Family Medicine

## 2011-04-04 ENCOUNTER — Ambulatory Visit (INDEPENDENT_AMBULATORY_CARE_PROVIDER_SITE_OTHER): Payer: Medicare Other | Admitting: Family Medicine

## 2011-04-04 VITALS — BP 132/80 | HR 64 | Temp 97.6°F | Wt 137.0 lb

## 2011-04-04 DIAGNOSIS — E119 Type 2 diabetes mellitus without complications: Secondary | ICD-10-CM | POA: Diagnosis not present

## 2011-04-04 DIAGNOSIS — R739 Hyperglycemia, unspecified: Secondary | ICD-10-CM

## 2011-04-04 DIAGNOSIS — E785 Hyperlipidemia, unspecified: Secondary | ICD-10-CM | POA: Diagnosis not present

## 2011-04-04 DIAGNOSIS — R7309 Other abnormal glucose: Secondary | ICD-10-CM

## 2011-04-04 NOTE — Progress Notes (Signed)
  Subjective:     Julie [name] is a 78 y.o. female who presents for evaluation of nonbilious vomiting 4 times per day, diarrhea 10 times per day and nausea. Symptoms have been present for 4 days. Patient denies acholic stools, blood in stool, constipation, dark urine, dysuria, heartburn, hematemesis, hematuria and melena. Patient's oral intake has been decreased for liquids, decreased for solids and improving today. Patient's urine output has been adequate. Other contacts with similar symptoms include: none. Patient admits to recent travel history-- to [place] Patient has not had recent ingestion of possible contaminated food, toxic plants, or inappropriate medications/poisons.   The following portions of the patient's history were reviewed and updated as appropriate: allergies, current medications, past family history, past medical history, past social history, past surgical history and problem list.  Review of Systems Pertinent items are noted in HPI.    Objective:     BP 132/80  Pulse 64  Temp(Src) 97.6 F (36.4 C) (Oral)  Wt 137 lb (62.143 kg)  SpO2 97% General appearance: alert, cooperative, appears stated age and no distress Throat: lips, mucosa, and tongue normal; teeth and gums normal Lungs: clear to auscultation bilaterally Heart: S1, S2 normal    Assessment:    Acute Gastroenteritis --resolved  Hyperglycemia--- gave pt diabetes info ,  Glucometer, diet etc,  Refer to nutrition Plan:    1. Discussed oral rehydration, reintroduction of solid foods, signs of dehydration. 2. Return or go to emergency department if worsening symptoms, blood or bile, signs of dehydration, diarrhea lasting longer than 5 days or any new concerns. 3. Follow up in 3 months or sooner as needed.

## 2011-04-04 NOTE — Patient Instructions (Addendum)
Hyperglycemia Hyperglycemia occurs when the glucose (sugar) in your blood is too high. Hyperglycemia can happen for many reasons, but it most often happens to people who do not know they have diabetes or are not managing their diabetes properly.  CAUSES  Whether you have diabetes or not, there are other causes of hyperglycemia. Hyperglycemia can occur when you have diabetes, but it can also occur in other situations that you might not be as aware of, such as: Diabetes  If you have diabetes and are having problems controlling your blood glucose, hyperglycemia could occur because of some of the following reasons:   Not following your meal plan.   Not taking your diabetes medications or not taking it properly.   Exercising less or doing less activity than you normally do.   Being sick.  Pre-diabetes  This cannot be ignored. Before people develop Type 2 diabetes, they almost always have "pre-diabetes." This is when your blood glucose levels are higher than normal, but not yet high enough to be diagnosed as diabetes. Research has shown that some long-term damage to the body, especially the heart and circulatory system, may already be occurring during pre-diabetes. If you take action to manage your blood glucose when you have pre-diabetes, you may delay or prevent Type 2 diabetes from developing.  Stress  If you have diabetes, you may be "diet" controlled or on oral medications or insulin to control your diabetes. However, you may find that your blood glucose is higher than usual in the hospital whether you have diabetes or not. This is often referred to as "stress hyperglycemia." Stress can elevate your blood glucose. This happens because of hormones put out by the body during times of stress. If stress has been the cause of your high blood glucose, it can be followed regularly by your caregiver. That way he/she can make sure your hyperglycemia does not continue to get worse or progress to diabetes.    Steroids  Steroids are medications that act on the infection fighting system (immune system) to block inflammation or infection. One side effect can be a rise in blood glucose. Most people can produce enough extra insulin to allow for this rise, but for those who cannot, steroids make blood glucose levels go even higher. It is not unusual for steroid treatments to "uncover" diabetes that is developing. It is not always possible to determine if the hyperglycemia will go away after the steroids are stopped. A special blood test called an A1c is sometimes done to determine if your blood glucose was elevated before the steroids were started.  SYMPTOMS  Thirsty.   Frequent urination.   Dry mouth.   Blurred vision.   Tired or fatigue.   Weakness.   Sleepy.   Tingling in feet or leg.  DIAGNOSIS  Diagnosis is made by monitoring blood glucose in one or all of the following ways:  A1c test. This is a chemical found in your blood.   Fingerstick blood glucose monitoring.   Laboratory results.  TREATMENT  First, knowing the cause of the hyperglycemia is important before the hyperglycemia can be treated. Treatment may include, but is not be limited to:  Education.   Change or adjustment in medications.   Change or adjustment in meal plan.   Treatment for an illness, infection, etc.   More frequent blood glucose monitoring.   Change in exercise plan.   Decreasing or stopping steroids.   Lifestyle changes.  HOME CARE INSTRUCTIONS   Test your blood glucose   as directed.   Exercise regularly. Your caregiver will give you instructions about exercise. Pre-diabetes or diabetes which comes on with stress is helped by exercising.   Eat wholesome, balanced meals. Eat often and at regular, fixed times. Your caregiver or nutritionist will give you a meal plan to guide your sugar intake.   Being at an ideal weight is important. If needed, losing as little as 10 to 15 pounds may help  improve blood glucose levels.  SEEK MEDICAL CARE IF:   You have questions about medicine, activity, or diet.   You continue to have symptoms (problems such as increased thirst, urination, or weight gain).  SEEK IMMEDIATE MEDICAL CARE IF:   You are vomiting or have diarrhea.   Your breath smells fruity.   You are breathing faster or slower.   You are very sleepy or incoherent.   You have numbness, tingling, or pain in your feet or hands.   You have chest pain.   Your symptoms get worse even though you have been following your caregiver's orders.   If you have any other questions or concerns.  Document Released: 06/21/2000 Document Revised: 12/15/2010 Document Reviewed: 08/17/2008 ExitCare Patient Information 2012 [employer].  Diabetes Meal Planning Guide The diabetes meal planning guide is a tool to help you plan your meals and snacks. It is important for people with diabetes to manage their blood glucose (sugar) levels. Choosing the right foods and the right amounts throughout your day will help control your blood glucose. Eating right can even help you improve your blood pressure and reach or maintain a healthy weight. CARBOHYDRATE COUNTING MADE EASY When you eat carbohydrates, they turn to sugar. This raises your blood glucose level. Counting carbohydrates can help you control this level so you feel better. When you plan your meals by counting carbohydrates, you can have more flexibility in what you eat and balance your medicine with your food intake. Carbohydrate counting simply means adding up the total amount of carbohydrate grams in your meals and snacks. Try to eat about the same amount at each meal. Foods with carbohydrates are listed below. Each portion below is 1 carbohydrate serving or 15 grams of carbohydrates. Ask your dietician how many grams of carbohydrates you should eat at each meal or snack. Grains and Starches  1 slice bread.    English muffin or  hotdog/hamburger bun.    cup cold cereal (unsweetened).   ? cup cooked pasta or rice.    cup starchy vegetables (corn, potatoes, peas, beans, winter squash).   1 tortilla (6 inches).    bagel.   1 waffle or pancake (size of a CD).    cup cooked cereal.   4 to 6 small crackers.  *Whole grain is recommended. Fruit  1 cup fresh unsweetened berries, melon, papaya, pineapple.   1 small fresh fruit.    banana or mango.    cup fruit juice (4 oz unsweetened).    cup canned fruit in natural juice or water.   2 tbs dried fruit.   12 to 15 grapes or cherries.  Milk and Yogurt  1 cup fat-free or 1% milk.   1 cup soy milk.   6 oz light yogurt with sugar-free sweetener.   6 oz low-fat soy yogurt.   6 oz plain yogurt.  Vegetables  1 cup raw or  cup cooked is counted as 0 carbohydrates or a "free" food.   If you eat 3 or more servings at 1 meal, count   them as 1 carbohydrate serving.  Other Carbohydrates   oz chips or pretzels.    cup ice cream or frozen yogurt.    cup sherbet or sorbet.   2 inch square cake, no frosting.   1 tbs honey, sugar, jam, jelly, or syrup.   2 small cookies.   3 squares of graham crackers.   3 cups popcorn.   6 crackers.   1 cup broth-based soup.   Count 1 cup casserole or other mixed foods as 2 carbohydrate servings.   Foods with less than 20 calories in a serving may be counted as 0 carbohydrates or a "free" food.  You may want to purchase a book or computer software that lists the carbohydrate gram counts of different foods. In addition, the nutrition facts panel on the labels of the foods you eat are a good source of this information. The label will tell you how big the serving size is and the total number of carbohydrate grams you will be eating per serving. Divide this number by 15 to obtain the number of carbohydrate servings in a portion. Remember, 1 carbohydrate serving equals 15 grams of carbohydrate. SERVING  SIZES Measuring foods and serving sizes helps you make sure you are getting the right amount of food. The list below tells how big or small some common serving sizes are.  1 oz.........4 stacked dice.   3 oz.........Deck of cards.   1 tsp........Tip of little finger.   1 tbs........Thumb.   2 tbs........Golf ball.    cup.......Half of a fist.   1 cup........A fist.  SAMPLE DIABETES MEAL PLAN Below is a sample meal plan that includes foods from the grain and starches, dairy, vegetable, fruit, and meat groups. A dietician can individualize a meal plan to fit your calorie needs and tell you the number of servings needed from each food group. However, controlling the total amount of carbohydrates in your meal or snack is more important than making sure you include all of the food groups at every meal. You may interchange carbohydrate containing foods (dairy, starches, and fruits). The meal plan below is an example of a 2000 calorie diet using carbohydrate counting. This meal plan has 17 carbohydrate servings. Breakfast  1 cup oatmeal (2 carb servings).    cup light yogurt (1 carb serving).   1 cup blueberries (1 carb serving).    cup almonds.  Snack  1 large apple (2 carb servings).   1 low-fat string cheese stick.  Lunch  Chicken breast salad.   1 cup spinach.    cup chopped tomatoes.   2 oz chicken breast, sliced.   2 tbs low-fat Italian dressing.   12 whole-wheat crackers (2 carb servings).   12 to 15 grapes (1 carb serving).   1 cup low-fat milk (1 carb serving).  Snack  1 cup carrots.    cup hummus (1 carb serving).  Dinner  3 oz broiled salmon.   1 cup Howat rice (3 carb servings).  Snack  1  cups steamed broccoli (1 carb serving) drizzled with 1 tsp olive oil and lemon juice.   1 cup light pudding (2 carb servings).  DIABETES MEAL PLANNING WORKSHEET Your dietician can use this worksheet to help you decide how many servings of foods and what  types of foods are right for you.  BREAKFAST Food Group and Servings / Carb Servings Grain/Starches __________________________________ Dairy __________________________________________ Vegetable ______________________________________ Fruit ___________________________________________ Meat __________________________________________ Fat ____________________________________________ LUNCH Food Group and Servings / Carb   Servings Grain/Starches ___________________________________ Dairy ___________________________________________ Fruit ____________________________________________ Meat ___________________________________________ Fat _____________________________________________ DINNER Food Group and Servings / Carb Servings Grain/Starches ___________________________________ Dairy ___________________________________________ Fruit ____________________________________________ Meat ___________________________________________ Fat _____________________________________________ SNACKS Food Group and Servings / Carb Servings Grain/Starches ___________________________________ Dairy ___________________________________________ Vegetable _______________________________________ Fruit ____________________________________________ Meat ___________________________________________ Fat _____________________________________________ DAILY TOTALS Starches _________________________ Vegetable ________________________ Fruit ____________________________ Dairy ____________________________ Meat ____________________________ Fat ______________________________ Document Released: 09/22/2004 Document Revised: 12/15/2010 Document Reviewed: 08/03/2008 ExitCare Patient Information 2012 [employer]. 

## 2011-04-05 ENCOUNTER — Telehealth: Payer: Self-pay

## 2011-04-05 MED ORDER — ONETOUCH DELICA LANCETS MISC: Status: DC

## 2011-04-05 MED ORDER — GLUCOSE BLOOD VI STRP: ORAL_STRIP | Status: DC

## 2011-04-05 NOTE — Telephone Encounter (Signed)
Faxed.   KP 

## 2011-05-10 ENCOUNTER — Ambulatory Visit: Payer: Medicare Other | Admitting: *Deleted

## 2011-06-02 ENCOUNTER — Encounter: Payer: Medicare Other | Attending: Family Medicine | Admitting: *Deleted

## 2011-06-02 ENCOUNTER — Encounter: Payer: Self-pay | Admitting: *Deleted

## 2011-06-02 DIAGNOSIS — Z713 Dietary counseling and surveillance: Secondary | ICD-10-CM | POA: Insufficient documentation

## 2011-06-02 DIAGNOSIS — E119 Type 2 diabetes mellitus without complications: Secondary | ICD-10-CM | POA: Insufficient documentation

## 2011-06-02 NOTE — Progress Notes (Signed)
  Medical Nutrition Therapy:  Appt start time: 1000 end time:  1100.  Assessment:  T2DM.  Pt here for assessment of new diagnosis. Meter shows BGs of 95-127 mg, though time not set and unknown if results are fasting or 2 hr PPG (checks every other day and varies fasting/after meals). Reports 10 lb weight loss on her own since dx. Has also changed dietary habits to reflect ~30g CHO at meals and lower fat/sodium choices. Doing very well from research done online.   MEDICATIONS: See medication list; reconciled with pt.    DIETARY INTAKE:  Usual eating pattern includes 3 meals and 0 snacks per day.  24-hr recall: Unable to obtain full recall d/t time constraints.  Reports many dietary changes since diagnosis. For breakfast, she may have 1 egg and1 pc toast OR yogurt instead of bacon, etc; for lunch, she may have a [place] burger on a bun made at home with a salad (instead of french fries).   Usual physical activity: Walks dog 3-4x/day; mild pace  Estimated energy needs: 1100 calories (maintenance @ PA of 1.1) 120-125 g carbohydrates  Progress Towards Goal(s):  In progress.   Nutritional Diagnosis:  [name]-2.1 Impaired nutrient utilization related to glucose metabolism as evidenced by new diagnosis of T2DM and MD referral for MNT.    Intervention:  Nutrition education.  Handouts given during visit include:  Living Well with Diabetes - [employer]  30g and 45g CHO meal plans  Meal planning card (yellow card)  Monitoring/Evaluation:  Dietary intake, exercise, A1c, BG trends, and body weight in 3 month(s) or prn.

## 2011-06-02 NOTE — Patient Instructions (Signed)
Diabetes Self-Care  Feet  Daily inspection of feet (look for red/whit spots, signs of infection, blisters, dryness)  Dryness of skin: Bathe daily and use lotion containing lanolin  Dry/Callus Areas: Use pumice stone to remove callus tissue  Diabetic socks (Thick, mixed cotton/acrylic)  Diabetic shoes (Need MD prescription, Check with insurance for coverage)  Eyes  Yearly dilated eye exam  Teeth and Mouth  Brush and Floss Daily  Cleaning/Exam every 6 months  Monitoring Blood Glucose  Fasting glucose (80-120)  Check as instructed by your doctor  Pre-Meal (80-120)  2 hrs after the first bit of a meal (80-160)  Bedtime (100-140)  HgbA1C  Check every 3-6 months per MD  Goal <7%  Medical Care American Diabetes Association recommends MD visits every 3-6 months  Exercise  Aerobic exercise (walking, biking, hiking, swimming) daily   Nutrition  Carbohydrates  Convert to glucose  Consume carbohydrates as a part of meals and snacks  Avoid the use of concentrated sweets as they promote the rapid rise in blood glucose (sweet tea, juice, regular soda, and sweetened beverages)  Choose sugar substitutes for sweetening  Fiber  Slow the uptake of glucose from carbohydrate  Fibrous foods include whole grain breads, cereals, vegetables, beans, peas, and lentils  Use food label to determine foods highest in fiber  Breads should have >2 grams per serving  Cereals should have >3 grams per serving  Sugar  Aim for 0-9 grams per serving  Choose your use of foods containing higher sugar levels wisely  Counting carbohydrates  Breakfast =  30-45 grams carbohydrate (2-3 carb choices)  AM Snack = 0-15 grams carbohydrate + protein (1 carb choices)  Lunch =  30-45 grams carbohydrate (2-3 carb choices)  PM Snack = 0-15 grams carbohydrate + protein (1 carb choices)  Dinner =  30-45 grams carbohydrate (2-3 carb choices)  Bedtime Snack = 0-15 grams carbohydrate +  protein (1 carb choices)  Non-Starchy Vegetables  High in fiber and low in starches/calories  Try to have a large serving a lunch/dinner  Considered a "FREE" choice  High in vitamins, minerals, and anti-oxidants  Deep rich colored vegetables  Proteins  Build and repair tissues and do not contain glucose  Watch for added carbohydrate to protein (such as gravy, breading, sauces)  Choose lean proteins  Bake, broil, grill, and roast proteins,   Serving size = Size of the palm of the hand  Have a protein source at all meals and snacks  Fats  Keep at 1-2 servings per meal  Measure fat servings  Choose reduced fat or low-fat varieties of salad dressings and condiments  Use food label for serving sizes

## 2011-06-22 ENCOUNTER — Other Ambulatory Visit: Payer: Self-pay | Admitting: Family Medicine

## 2011-06-22 MED ORDER — ALLOPURINOL 100 MG PO TABS: 200.0000 mg | ORAL_TABLET | Freq: Every day | ORAL | Status: DC

## 2011-06-22 NOTE — Telephone Encounter (Signed)
refill zyloprim 100mg  tab #60 Take two tablets by mouth every day  Last fill 5.11.13 Last ov 3.26.13

## 2011-10-12 DIAGNOSIS — Z23 Encounter for immunization: Secondary | ICD-10-CM | POA: Diagnosis not present

## 2011-10-14 ENCOUNTER — Other Ambulatory Visit: Payer: Self-pay | Admitting: Family Medicine

## 2011-11-06 ENCOUNTER — Ambulatory Visit (INDEPENDENT_AMBULATORY_CARE_PROVIDER_SITE_OTHER): Payer: Medicare Other | Admitting: Family Medicine

## 2011-11-06 ENCOUNTER — Encounter: Payer: Self-pay | Admitting: Family Medicine

## 2011-11-06 VITALS — BP 120/82 | HR 55 | Temp 97.8°F | Ht <= 58 in | Wt 131.8 lb

## 2011-11-06 DIAGNOSIS — I10 Essential (primary) hypertension: Secondary | ICD-10-CM | POA: Diagnosis not present

## 2011-11-06 DIAGNOSIS — E119 Type 2 diabetes mellitus without complications: Secondary | ICD-10-CM

## 2011-11-06 DIAGNOSIS — E785 Hyperlipidemia, unspecified: Secondary | ICD-10-CM

## 2011-11-06 DIAGNOSIS — Z1231 Encounter for screening mammogram for malignant neoplasm of breast: Secondary | ICD-10-CM

## 2011-11-06 DIAGNOSIS — Z1239 Encounter for other screening for malignant neoplasm of breast: Secondary | ICD-10-CM

## 2011-11-06 DIAGNOSIS — M109 Gout, unspecified: Secondary | ICD-10-CM | POA: Diagnosis not present

## 2011-11-06 DIAGNOSIS — M810 Age-related osteoporosis without current pathological fracture: Secondary | ICD-10-CM

## 2011-11-06 LAB — HEPATIC FUNCTION PANEL
AST: 20 U/L (ref 0–37)
Alkaline Phosphatase: 41 U/L (ref 39–117)
Bilirubin, Direct: 0.1 mg/dL (ref 0.0–0.3)
Total Bilirubin: 1.2 mg/dL (ref 0.3–1.2)

## 2011-11-06 LAB — BASIC METABOLIC PANEL
BUN: 21 mg/dL (ref 6–23)
Chloride: 102 mEq/L (ref 96–112)
Creatinine, Ser: 0.8 mg/dL (ref 0.4–1.2)
GFR: 70.33 mL/min (ref >60.00)
Potassium: 3.7 mEq/L (ref 3.5–5.1)

## 2011-11-06 LAB — HEMOGLOBIN A1C: Hgb A1c MFr Bld: 5.8 % (ref 4.6–6.5)

## 2011-11-06 LAB — MICROALBUMIN / CREATININE URINE RATIO
Creatinine,U: 143.4 mg/dL
Microalb, Ur: 2.9 mg/dL — ABNORMAL HIGH (ref 0.0–1.9)

## 2011-11-06 LAB — LIPID PANEL
HDL: 60.5 mg/dL (ref >39.00)
LDL Cholesterol: 66 mg/dL (ref 0–99)
VLDL: 18.2 mg/dL (ref 0.0–40.0)

## 2011-11-06 NOTE — Assessment & Plan Note (Signed)
Stable con't meds 

## 2011-11-06 NOTE — Assessment & Plan Note (Signed)
Diet controlled Check labs con't daily accu checks

## 2011-11-06 NOTE — Assessment & Plan Note (Signed)
Check labs con't meds 

## 2011-11-06 NOTE — Patient Instructions (Signed)
Osteoporosis  Throughout your life, your body breaks down old bone and replaces it with new bone. As you get older, your body does not replace bone as quickly as it breaks it down. By the age of 30 years, most people begin to gradually lose bone because of the imbalance between bone loss and replacement. Some people lose more bone than others. Bone loss beyond a specified normal degree is considered osteoporosis.    Osteoporosis affects the strength and durability of your bones. The inside of the ends of your bones and your flat bones, like the bones of your pelvis, look like honeycomb, filled with tiny open spaces. As bone loss occurs, your bones become less dense. This means that the open spaces inside your bones become bigger and the walls between these spaces become thinner. This makes your bones weaker. Bones of a person with osteoporosis can become so weak that they can break (fracture) during minor accidents, such as a simple fall.  CAUSES    The following factors have been associated with the development of osteoporosis:   Smoking.   Drinking more than 2 alcoholic drinks several days per week.   Long-term use of certain medicines:   Corticosteroids.   Chemotherapy medicines.   Thyroid medicines.   Antiepileptic medicines.   Gonadal hormone suppression medicine.   Immunosuppression medicine.   Being underweight.   Lack of physical activity.   Lack of exposure to the sun. This can lead to vitamin D deficiency.   Certain medical conditions:   Certain inflammatory bowel diseases, such as Crohn's disease and ulcerative colitis.   Diabetes.   Hyperthyroidism.   Hyperparathyroidism.  RISK FACTORS  Anyone can develop osteoporosis. However, the following factors can increase your risk of developing osteoporosis:   Gender Women are at higher risk than men.   Age Being older than 50 years increases your risk.   Ethnicity White and Asian people have an increased risk.    Weight Being extremely underweight can increase your risk of osteoporosis.   Family history of osteoporosis Having a family member who has developed osteoporosis can increase your risk.  SYMPTOMS    Usually, people with osteoporosis have no symptoms.    DIAGNOSIS    Signs during a physical exam that may prompt your caregiver to suspect osteoporosis include:   Decreased height. This is usually caused by the compression of the bones that form your spine (vertebrae) because they have weakened and become fractured.   A curving or rounding of the upper back (kyphosis).  To confirm signs of osteoporosis, your caregiver may request a procedure that uses 2 low-dose X-ray beams with different levels of energy to measure your bone mineral density (dual-energy X-ray absorptiometry [DXA]). Also, your caregiver may check your level of vitamin D.  TREATMENT    The goal of osteoporosis treatment is to strengthen bones in order to decrease the risk of bone fractures. There are different types of medicines available to help achieve this goal. Some of these medicines work by slowing the processes of bone loss. Some medicines work by increasing bone density. Treatment also involves making sure that your levels of calcium and vitamin D are adequate.  PREVENTION    There are things you can do to help prevent osteoporosis. Adequate intake of calcium and vitamin D can help you achieve optimal bone mineral density. Regular exercise can also help, especially resistance and high-impact activities. If you smoke, quitting smoking is an important part of osteoporosis prevention.  MAKE 

## 2011-11-06 NOTE — Progress Notes (Signed)
  Subjective:    Patient ID: Julie [name], female    DOB: 04/11/1929, 78 y.o.   MRN: [id number]  HPI Pt here for f/u .  No complaints. HYPERTENSION Disease Monitoring Blood pressure range-not checking Chest pain- no      Dyspnea- no Medications Compliance- good Lightheadedness- no   Edema- no   DIABETES Disease Monitoring Blood Sugar ranges-  112-135 Polyuria- no New Visual problems- no Medications Compliance- good Hypoglycemic symptoms- no   HYPERLIPIDEMIA Disease Monitoring See symptoms for Hypertension Medications Compliance- good RUQ pain- no  Muscle aches- no  ROS See HPI above   PMH Smoking Status noted    Review of Systems As above    Objective:   Physical Exam  Constitutional: She is oriented to person, place, and time. She appears well-developed and well-nourished.  Neck: Normal range of motion. Neck supple.  Cardiovascular: Normal rate, regular rhythm and normal heart sounds.   Pulmonary/Chest: Effort normal and breath sounds normal. No respiratory distress. She has no wheezes. She has no rales. She exhibits no tenderness.  Musculoskeletal: She exhibits no edema and no tenderness.       Sensory exam of the foot is normal, tested with the monofilament. Good pulses, no lesions or ulcers, good peripheral pulses.  Neurological: She is alert and oriented to person, place, and time.  Psychiatric: She has a normal mood and affect. Her behavior is normal. Judgment and thought content normal.   Filed Vitals:   11/06/11 1000  BP: 120/82  Pulse: 55  Temp: 97.8 F (36.6 C)  TempSrc: Oral  Height: 4' 9.25" (1.454 m)  Weight: 131 lb 12.8 oz (59.784 kg)  SpO2: 96%         Assessment & Plan:

## 2011-11-10 ENCOUNTER — Other Ambulatory Visit: Payer: Self-pay | Admitting: Family Medicine

## 2011-11-23 DIAGNOSIS — I10 Essential (primary) hypertension: Secondary | ICD-10-CM | POA: Diagnosis not present

## 2011-11-23 DIAGNOSIS — E785 Hyperlipidemia, unspecified: Secondary | ICD-10-CM | POA: Diagnosis not present

## 2011-11-23 DIAGNOSIS — Z1231 Encounter for screening mammogram for malignant neoplasm of breast: Secondary | ICD-10-CM | POA: Diagnosis not present

## 2011-11-23 NOTE — Addendum Note (Signed)
Addended by: Silvio Pate D on: 11/23/2011 01:40 PM   Modules accepted: Orders

## 2011-12-15 ENCOUNTER — Ambulatory Visit
Admission: RE | Admit: 2011-12-15 | Discharge: 2011-12-15 | Disposition: A | Discharge status: Home / Self Care | Payer: Medicare Other | Source: Ambulatory Visit | Attending: Family Medicine | Admitting: Family Medicine

## 2011-12-15 DIAGNOSIS — M810 Age-related osteoporosis without current pathological fracture: Secondary | ICD-10-CM

## 2011-12-15 DIAGNOSIS — Z1231 Encounter for screening mammogram for malignant neoplasm of breast: Secondary | ICD-10-CM | POA: Diagnosis not present

## 2011-12-22 MED ORDER — ALENDRONATE SODIUM 70 MG PO TABS: 70.0000 mg | ORAL_TABLET | ORAL | Status: DC

## 2012-01-05 ENCOUNTER — Other Ambulatory Visit: Payer: Self-pay | Admitting: Family Medicine

## 2012-01-05 DIAGNOSIS — M109 Gout, unspecified: Secondary | ICD-10-CM

## 2012-01-05 NOTE — Telephone Encounter (Signed)
Refill for allopurinol sent to [facility] pharmacy

## 2012-02-05 ENCOUNTER — Telehealth: Payer: Self-pay

## 2012-02-05 NOTE — Telephone Encounter (Signed)
Error.     KP 

## 2012-05-18 ENCOUNTER — Other Ambulatory Visit: Payer: Self-pay | Admitting: Family Medicine

## 2012-06-06 ENCOUNTER — Encounter: Payer: Self-pay | Admitting: Family Medicine

## 2012-06-06 ENCOUNTER — Ambulatory Visit (INDEPENDENT_AMBULATORY_CARE_PROVIDER_SITE_OTHER): Payer: Medicare Other | Admitting: Family Medicine

## 2012-06-06 VITALS — BP 142/74 | HR 65 | Temp 98.7°F | Wt 140.0 lb

## 2012-06-06 DIAGNOSIS — I10 Essential (primary) hypertension: Secondary | ICD-10-CM

## 2012-06-06 DIAGNOSIS — M109 Gout, unspecified: Secondary | ICD-10-CM | POA: Diagnosis not present

## 2012-06-06 DIAGNOSIS — E785 Hyperlipidemia, unspecified: Secondary | ICD-10-CM | POA: Diagnosis not present

## 2012-06-06 DIAGNOSIS — E119 Type 2 diabetes mellitus without complications: Secondary | ICD-10-CM

## 2012-06-06 MED ORDER — SIMVASTATIN 80 MG PO TABS: ORAL_TABLET | ORAL | Status: DC

## 2012-06-06 MED ORDER — METOPROLOL TARTRATE 100 MG PO TABS: ORAL_TABLET | ORAL | Status: DC

## 2012-06-06 MED ORDER — ALLOPURINOL 100 MG PO TABS: ORAL_TABLET | ORAL | Status: DC

## 2012-06-06 MED ORDER — FENOFIBRATE 160 MG PO TABS: ORAL_TABLET | ORAL | Status: DC

## 2012-06-06 NOTE — Assessment & Plan Note (Signed)
Check labs 

## 2012-06-06 NOTE — Progress Notes (Signed)
  Subjective:    Patient ID: Julie [name], female    DOB: [birth year], 79 y.o.   MRN: [id number]  HPI HYPERTENSION Disease Monitoring Blood pressure range-not checking Chest pain- no      Dyspnea- no Medications Compliance- good Lightheadedness- no   Edema- no   DIABETES Disease Monitoring Blood Sugar ranges-115-120 Polyuria- no New Visual problems- no Medications Compliance- good Hypoglycemic symptoms- no   HYPERLIPIDEMIA Disease Monitoring See symptoms for Hypertension Medications Compliance- good RUQ pain- no  Muscle aches- no  ROS See HPI above   PMH Smoking Status noted     Review of Systems    as above Objective:   Physical Exam BP 142/74  Pulse 65  Temp(Src) 98.7 F (37.1 C) (Oral)  Wt 140 lb (63.504 kg)  BMI 30.04 kg/m2  SpO2 97% General appearance: alert, cooperative, appears stated age and no distress Neck: no adenopathy, no carotid bruit, no JVD, supple, symmetrical, trachea midline and thyroid not enlarged, symmetric, no tenderness/mass/nodules Lungs: clear to auscultation bilaterally Heart: S1, S2 normal Extremities: extremities normal, atraumatic, no cyanosis or edema       Assessment & Plan:

## 2012-06-06 NOTE — Assessment & Plan Note (Signed)
Stable con't meds 

## 2012-06-06 NOTE — Patient Instructions (Signed)
Diabetes and Standards of Medical Care  Diabetes is complicated. You may find that your diabetes team includes a dietitian, nurse, diabetes educator, eye doctor, and more. To help everyone know what is going on and to help you get the care you deserve, the following schedule of care was developed to help keep you on track. Below are the tests, exams, vaccines, medicines, education, and plans you will need. A1c test  Performed at least 2 times a year if you are meeting treatment goals.  Performed 4 times a year if therapy has changed or if you are not meeting treatment goals. Blood pressure test  Performed at every routine medical visit. The goal is less than 120/80 mmHg. Dental exam  Follow up with the dentist regularly. Eye exam  Diagnosed with type 1 diabetes as a child: Get an exam upon reaching the age of 10 years or older and having had diabetes for 3 5 years. Yearly eye exams are recommended after that initial eye exam.  Diagnosed with type 1 diabetes as an adult: Get an exam within 5 years of diagnosis and then yearly.  Diagnosed with type 2 diabetes: Get an exam as soon as possible after the diagnosis and then yearly. Foot care exam  Visual foot exams are performed at every routine medical visit. The exams check for cuts, injuries, or other problems with the feet.  A comprehensive foot exam should be done yearly. This includes visual inspection as well as assessing foot pulses and testing for loss of sensation. Kidney function test (urine microalbumin)  Performed once a year.  Type 1 diabetes: The first test is performed 5 years after diagnosis.  Type 2 diabetes: The first test is performed at the time of diagnosis.  A serum creatinine and estimated glomerular filtration rate (eGFR) test is done once a year to tell the level of chronic kidney disease (CKD), if present. Lipid profile (Cholesterol, HDL, LDL, Triglycerides)  Performed every 5 years for most people.  The  goal for LDL is less than 100 mg/dl. If at high risk, the goal is less than 70 mg/dl.  The goal for HDL is 40 mg/dl 50 mg/dl for men and 50 mg/dl 60 mg/dl for women. An HDL cholesterol of 60 mg/dL or higher gives some protection against heart disease.  The goal for triglycerides is less than 150 mg/dl. Influenza vaccine, pneumococcal vaccine, and hepatitis B vaccine  The influenza vaccine is recommended yearly.  The pneumococcal vaccine is generally given once in a lifetime. However, there are some instances when another vaccination is recommended. Check with your caregiver.  The hepatitis B vaccine is also recommended for adults with diabetes. Diabetes self-management education  Recommended at diagnosis and ongoing as needed. Treatment plan  Reviewed at every medical visit. Document Released: 10/23/2008 Document Revised: 12/13/2011 Document Reviewed: 06/28/2010 [employer] Patient Information 2014 [employer].  

## 2012-06-06 NOTE — Assessment & Plan Note (Signed)
Check labs con't meds 

## 2012-07-09 ENCOUNTER — Other Ambulatory Visit (INDEPENDENT_AMBULATORY_CARE_PROVIDER_SITE_OTHER): Payer: Medicare Other

## 2012-07-09 DIAGNOSIS — E119 Type 2 diabetes mellitus without complications: Secondary | ICD-10-CM | POA: Diagnosis not present

## 2012-07-09 DIAGNOSIS — E785 Hyperlipidemia, unspecified: Secondary | ICD-10-CM | POA: Diagnosis not present

## 2012-07-09 DIAGNOSIS — N390 Urinary tract infection, site not specified: Secondary | ICD-10-CM | POA: Diagnosis not present

## 2012-07-09 DIAGNOSIS — M109 Gout, unspecified: Secondary | ICD-10-CM

## 2012-07-09 DIAGNOSIS — I10 Essential (primary) hypertension: Secondary | ICD-10-CM

## 2012-07-09 LAB — POCT URINALYSIS DIPSTICK
Blood, UA: NEGATIVE
Nitrite, UA: NEGATIVE
Spec Grav, UA: >=1.03
Urobilinogen, UA: 0.2
pH, UA: 6

## 2012-07-09 LAB — CBC WITH DIFFERENTIAL/PLATELET
Basophils Absolute: 0 10*3/uL (ref 0.0–0.1)
Eosinophils Absolute: 0.1 10*3/uL (ref 0.0–0.7)
Eosinophils Relative: 1.9 % (ref 0.0–5.0)
Lymphs Abs: 1.9 10*3/uL (ref 0.7–4.0)
MCV: 95.4 fl (ref 78.0–100.0)
Monocytes Absolute: 0.3 10*3/uL (ref 0.1–1.0)
Neutrophils Relative %: 46.7 % (ref 43.0–77.0)
Platelets: 233 10*3/uL (ref 150.0–400.0)
RDW: 15 % — ABNORMAL HIGH (ref 11.5–14.6)
WBC: 4.4 10*3/uL — ABNORMAL LOW (ref 4.5–10.5)

## 2012-07-09 LAB — BASIC METABOLIC PANEL
BUN: 15 mg/dL (ref 6–23)
Chloride: 106 mEq/L (ref 96–112)
Creatinine, Ser: 0.7 mg/dL (ref 0.4–1.2)
GFR: 82.73 mL/min (ref >60.00)
Glucose, Bld: 113 mg/dL — ABNORMAL HIGH (ref 70–99)

## 2012-07-09 LAB — HEMOGLOBIN A1C: Hgb A1c MFr Bld: 6.4 % (ref 4.6–6.5)

## 2012-07-09 LAB — MICROALBUMIN / CREATININE URINE RATIO
Creatinine,U: 143.6 mg/dL
Microalb Creat Ratio: 2.4 mg/g (ref 0.0–30.0)

## 2012-07-09 LAB — HEPATIC FUNCTION PANEL
AST: 19 U/L (ref 0–37)
Albumin: 4.1 g/dL (ref 3.5–5.2)
Total Bilirubin: 0.5 mg/dL (ref 0.3–1.2)

## 2012-07-09 LAB — LIPID PANEL
Cholesterol: 120 mg/dL (ref 0–200)
HDL: 53.3 mg/dL (ref >39.00)
LDL Cholesterol: 45 mg/dL (ref 0–99)
Triglycerides: 110 mg/dL (ref 0.0–149.0)
VLDL: 22 mg/dL (ref 0.0–40.0)

## 2012-07-10 LAB — URINE CULTURE

## 2012-07-18 ENCOUNTER — Other Ambulatory Visit: Payer: Self-pay

## 2012-07-25 DIAGNOSIS — N390 Urinary tract infection, site not specified: Secondary | ICD-10-CM | POA: Diagnosis not present

## 2012-07-25 DIAGNOSIS — I10 Essential (primary) hypertension: Secondary | ICD-10-CM | POA: Diagnosis not present

## 2012-07-25 DIAGNOSIS — M109 Gout, unspecified: Secondary | ICD-10-CM | POA: Diagnosis not present

## 2012-07-25 DIAGNOSIS — E119 Type 2 diabetes mellitus without complications: Secondary | ICD-10-CM | POA: Diagnosis not present

## 2012-07-25 DIAGNOSIS — E785 Hyperlipidemia, unspecified: Secondary | ICD-10-CM | POA: Diagnosis not present

## 2012-07-26 LAB — PROTEIN, URINE, 24 HOUR
Protein, 24H Urine: 27 mg/d — ABNORMAL LOW (ref 50–100)
Protein, Urine: 3 mg/dL

## 2012-09-19 DIAGNOSIS — Z23 Encounter for immunization: Secondary | ICD-10-CM | POA: Diagnosis not present

## 2012-10-16 ENCOUNTER — Telehealth: Payer: Self-pay | Admitting: Family Medicine

## 2012-10-16 DIAGNOSIS — E785 Hyperlipidemia, unspecified: Secondary | ICD-10-CM

## 2012-10-16 MED ORDER — FENOFIBRATE 160 MG PO TABS: ORAL_TABLET | ORAL | Status: DC

## 2012-10-16 NOTE — Telephone Encounter (Signed)
Patient states that [facility] on [address] has not received a response from our office regarding her refill request for her Fenofibrate rx. Please advise.

## 2012-11-14 ENCOUNTER — Other Ambulatory Visit: Payer: Self-pay | Admitting: Family Medicine

## 2013-01-20 ENCOUNTER — Other Ambulatory Visit: Payer: Self-pay | Admitting: Family Medicine

## 2013-01-24 ENCOUNTER — Ambulatory Visit (INDEPENDENT_AMBULATORY_CARE_PROVIDER_SITE_OTHER): Payer: Medicare Other | Admitting: Family Medicine

## 2013-01-24 ENCOUNTER — Encounter: Payer: Self-pay | Admitting: Family Medicine

## 2013-01-24 VITALS — BP 132/80 | HR 61 | Wt 144.2 lb

## 2013-01-24 DIAGNOSIS — M109 Gout, unspecified: Secondary | ICD-10-CM

## 2013-01-24 DIAGNOSIS — E119 Type 2 diabetes mellitus without complications: Secondary | ICD-10-CM

## 2013-01-24 DIAGNOSIS — E1159 Type 2 diabetes mellitus with other circulatory complications: Secondary | ICD-10-CM | POA: Diagnosis not present

## 2013-01-24 DIAGNOSIS — I10 Essential (primary) hypertension: Secondary | ICD-10-CM

## 2013-01-24 DIAGNOSIS — E785 Hyperlipidemia, unspecified: Secondary | ICD-10-CM

## 2013-01-24 MED ORDER — SIMVASTATIN 80 MG PO TABS: ORAL_TABLET | ORAL | Status: DC

## 2013-01-24 MED ORDER — ALLOPURINOL 100 MG PO TABS: ORAL_TABLET | ORAL | Status: DC

## 2013-01-24 NOTE — Patient Instructions (Signed)

## 2013-01-24 NOTE — Progress Notes (Signed)
Pre visit review using our clinic review tool, if applicable. No additional management support is needed unless otherwise documented below in the visit note. 

## 2013-01-24 NOTE — Progress Notes (Signed)
   Subjective:    Patient ID: Julie [name], female    DOB: September 12, 1929, 80 y.o.   MRN: [id number]  HPI  HPI HYPERTENSION  Blood pressure range-not checking  Chest pain- no      Dyspnea- no Lightheadedness- no   Edema- no Other side effects - no   Medication compliance: good Low salt diet- yes  DIABETES  Blood Sugar ranges-106--130  Polyuria- no New Visual problems- no Hypoglycemic symptoms- no Other side effects-no Medication compliance - good Last eye exam- overdue Foot exam- today  HYPERLIPIDEMIA  Medication compliance- good RUQ pain- no  Muscle aches- no Other side effects-no  ROS See HPI above   PMH Smoking Status noted       Review of Systems    as above Objective:   Physical Exam  BP 132/80  Pulse 61  Wt 144 lb 3.2 oz (65.409 kg)  SpO2 96% General appearance: alert, cooperative, appears stated age and no distress Head: Normocephalic, without obvious abnormality, atraumatic Eyes: conjunctivae/corneas clear. PERRL, EOM's intact. Fundi benign. Ears: normal TM's and external ear canals both ears Nose: Nares normal. Septum midline. Mucosa normal. No drainage or sinus tenderness. Throat: lips, mucosa, and tongue normal; teeth and gums normal Neck: no adenopathy, supple, symmetrical, trachea midline and thyroid not enlarged, symmetric, no tenderness/mass/nodules Lungs: clear to auscultation bilaterally Heart: regular rate and rhythm, S1, S2 normal, no murmur, click, rub or gallop Abdomen: soft, non-tender; bowel sounds normal; no masses,  no organomegaly Extremities: extremities normal, atraumatic, no cyanosis or edema Pulses: 2+ and symmetric Skin: Skin color, texture, turgor normal. No rashes or lesions Lymph nodes: Cervical, supraclavicular, and axillary nodes normal.      Assessment & Plan:

## 2013-01-25 ENCOUNTER — Telehealth: Payer: Self-pay | Admitting: Family Medicine

## 2013-01-25 NOTE — Telephone Encounter (Signed)
Relevant patient education assigned to patient using Emmi. ° °

## 2013-01-26 ENCOUNTER — Encounter: Payer: Self-pay | Admitting: Family Medicine

## 2013-01-26 NOTE — Assessment & Plan Note (Signed)
Check labs con't meds 

## 2013-01-26 NOTE — Assessment & Plan Note (Signed)
Check labs Cont' meds 

## 2013-01-26 NOTE — Assessment & Plan Note (Signed)
Stable con't meds 

## 2013-01-27 ENCOUNTER — Other Ambulatory Visit (INDEPENDENT_AMBULATORY_CARE_PROVIDER_SITE_OTHER): Payer: Medicare Other

## 2013-01-27 DIAGNOSIS — M109 Gout, unspecified: Secondary | ICD-10-CM

## 2013-01-27 DIAGNOSIS — E785 Hyperlipidemia, unspecified: Secondary | ICD-10-CM

## 2013-01-27 DIAGNOSIS — E1159 Type 2 diabetes mellitus with other circulatory complications: Secondary | ICD-10-CM

## 2013-01-27 LAB — POCT URINALYSIS DIPSTICK
BILIRUBIN UA: NEGATIVE
Blood, UA: NEGATIVE
Glucose, UA: NEGATIVE
KETONES UA: NEGATIVE
Leukocytes, UA: NEGATIVE
Nitrite, UA: NEGATIVE
PH UA: 6
Protein, UA: NEGATIVE
Spec Grav, UA: >=1.03
Urobilinogen, UA: 0.2

## 2013-01-27 LAB — LIPID PANEL
Cholesterol: 139 mg/dL (ref 0–200)
HDL: 54.9 mg/dL (ref >39.00)
LDL Cholesterol: 57 mg/dL (ref 0–99)
Total CHOL/HDL Ratio: 3
Triglycerides: 138 mg/dL (ref 0.0–149.0)
VLDL: 27.6 mg/dL (ref 0.0–40.0)

## 2013-01-27 LAB — HEPATIC FUNCTION PANEL
ALBUMIN: 4 g/dL (ref 3.5–5.2)
ALT: 12 U/L (ref 0–35)
AST: 18 U/L (ref 0–37)
Alkaline Phosphatase: 38 U/L — ABNORMAL LOW (ref 39–117)
Bilirubin, Direct: 0.1 mg/dL (ref 0.0–0.3)
TOTAL PROTEIN: 7.2 g/dL (ref 6.0–8.3)
Total Bilirubin: 0.9 mg/dL (ref 0.3–1.2)

## 2013-01-27 LAB — BASIC METABOLIC PANEL
BUN: 20 mg/dL (ref 6–23)
CO2: 28 mEq/L (ref 19–32)
Calcium: 9.1 mg/dL (ref 8.4–10.5)
Chloride: 104 mEq/L (ref 96–112)
Creatinine, Ser: 0.9 mg/dL (ref 0.4–1.2)
GFR: 60.74 mL/min (ref >60.00)
GLUCOSE: 141 mg/dL — AB (ref 70–99)
POTASSIUM: 3.7 meq/L (ref 3.5–5.1)
Sodium: 140 mEq/L (ref 135–145)

## 2013-01-27 LAB — MICROALBUMIN / CREATININE URINE RATIO
CREATININE, U: 236.8 mg/dL
MICROALB UR: 4.6 mg/dL — AB (ref 0.0–1.9)
MICROALB/CREAT RATIO: 1.9 mg/g (ref 0.0–30.0)

## 2013-01-27 LAB — HEMOGLOBIN A1C: Hgb A1c MFr Bld: 6.2 % (ref 4.6–6.5)

## 2013-01-27 LAB — URIC ACID: Uric Acid, Serum: 3.5 mg/dL (ref 2.4–7.0)

## 2013-01-31 DIAGNOSIS — E119 Type 2 diabetes mellitus without complications: Secondary | ICD-10-CM | POA: Diagnosis not present

## 2013-01-31 DIAGNOSIS — H26499 Other secondary cataract, unspecified eye: Secondary | ICD-10-CM | POA: Diagnosis not present

## 2013-03-25 ENCOUNTER — Other Ambulatory Visit: Payer: Self-pay | Admitting: Family Medicine

## 2013-03-25 ENCOUNTER — Telehealth: Payer: Self-pay | Admitting: Family Medicine

## 2013-03-25 DIAGNOSIS — E785 Hyperlipidemia, unspecified: Secondary | ICD-10-CM

## 2013-03-25 MED ORDER — FENOFIBRATE MICRONIZED 90 MG PO CAPS: ORAL_CAPSULE | ORAL | Status: DC

## 2013-03-25 MED ORDER — LOVASTATIN 20 MG PO TABS: 20.0000 mg | ORAL_TABLET | Freq: Every day | ORAL | Status: DC

## 2013-03-25 NOTE — Telephone Encounter (Signed)
To MD for review     KP 

## 2013-03-25 NOTE — Telephone Encounter (Signed)
Pt came by today requesting med refill on simvastatin oral tab 80 mg.  She said cost of med is so expensive the pharmacist said fenofibrate oral tablet could be substituted.  Please advise.  She stated samples were supplied to her previously and she is completely out now.  Please advise.

## 2013-07-09 HISTORY — PX: CATARACT EXTRACTION: SUR2

## 2013-08-07 DIAGNOSIS — H04129 Dry eye syndrome of unspecified lacrimal gland: Secondary | ICD-10-CM | POA: Diagnosis not present

## 2013-08-07 DIAGNOSIS — H26499 Other secondary cataract, unspecified eye: Secondary | ICD-10-CM | POA: Diagnosis not present

## 2013-08-07 DIAGNOSIS — E119 Type 2 diabetes mellitus without complications: Secondary | ICD-10-CM | POA: Diagnosis not present

## 2013-08-11 ENCOUNTER — Other Ambulatory Visit: Payer: Self-pay

## 2013-08-11 DIAGNOSIS — Z1231 Encounter for screening mammogram for malignant neoplasm of breast: Secondary | ICD-10-CM

## 2013-08-16 ENCOUNTER — Other Ambulatory Visit: Payer: Self-pay | Admitting: Family Medicine

## 2013-08-19 ENCOUNTER — Ambulatory Visit
Admission: RE | Admit: 2013-08-19 | Discharge: 2013-08-19 | Disposition: A | Discharge status: Home / Self Care | Payer: Medicare Other | Source: Ambulatory Visit

## 2013-08-19 DIAGNOSIS — Z1231 Encounter for screening mammogram for malignant neoplasm of breast: Secondary | ICD-10-CM

## 2013-08-20 ENCOUNTER — Encounter: Payer: Self-pay | Admitting: Family Medicine

## 2013-08-20 ENCOUNTER — Ambulatory Visit (INDEPENDENT_AMBULATORY_CARE_PROVIDER_SITE_OTHER): Payer: Medicare Other | Admitting: Family Medicine

## 2013-08-20 ENCOUNTER — Other Ambulatory Visit: Payer: Self-pay | Admitting: Family Medicine

## 2013-08-20 VITALS — BP 132/84 | HR 57 | Temp 98.1°F | Wt 141.0 lb

## 2013-08-20 DIAGNOSIS — E785 Hyperlipidemia, unspecified: Secondary | ICD-10-CM

## 2013-08-20 DIAGNOSIS — R82998 Other abnormal findings in urine: Secondary | ICD-10-CM

## 2013-08-20 DIAGNOSIS — R928 Other abnormal and inconclusive findings on diagnostic imaging of breast: Secondary | ICD-10-CM

## 2013-08-20 DIAGNOSIS — E1159 Type 2 diabetes mellitus with other circulatory complications: Secondary | ICD-10-CM

## 2013-08-20 DIAGNOSIS — R609 Edema, unspecified: Secondary | ICD-10-CM

## 2013-08-20 DIAGNOSIS — I10 Essential (primary) hypertension: Secondary | ICD-10-CM | POA: Diagnosis not present

## 2013-08-20 DIAGNOSIS — R8290 Unspecified abnormal findings in urine: Secondary | ICD-10-CM

## 2013-08-20 LAB — HEMOGLOBIN A1C: Hgb A1c MFr Bld: 6.5 % (ref 4.6–6.5)

## 2013-08-20 LAB — BASIC METABOLIC PANEL
BUN: 16 mg/dL (ref 6–23)
CHLORIDE: 102 meq/L (ref 96–112)
CO2: 28 meq/L (ref 19–32)
Calcium: 9.7 mg/dL (ref 8.4–10.5)
Creatinine, Ser: 0.7 mg/dL (ref 0.4–1.2)
GFR: 89.65 mL/min (ref >60.00)
Glucose, Bld: 129 mg/dL — ABNORMAL HIGH (ref 70–99)
POTASSIUM: 4.2 meq/L (ref 3.5–5.1)
SODIUM: 141 meq/L (ref 135–145)

## 2013-08-20 LAB — LIPID PANEL
CHOL/HDL RATIO: 3
Cholesterol: 122 mg/dL (ref 0–200)
HDL: 48.6 mg/dL (ref >39.00)
LDL CALC: 44 mg/dL (ref 0–99)
NonHDL: 73.4
Triglycerides: 146 mg/dL (ref 0.0–149.0)
VLDL: 29.2 mg/dL (ref 0.0–40.0)

## 2013-08-20 LAB — HEPATIC FUNCTION PANEL
ALK PHOS: 76 U/L (ref 39–117)
ALT: 18 U/L (ref 0–35)
AST: 27 U/L (ref 0–37)
Albumin: 4.2 g/dL (ref 3.5–5.2)
BILIRUBIN DIRECT: 0.2 mg/dL (ref 0.0–0.3)
BILIRUBIN TOTAL: 1 mg/dL (ref 0.2–1.2)
Total Protein: 7.6 g/dL (ref 6.0–8.3)

## 2013-08-20 LAB — POCT URINALYSIS DIPSTICK
BILIRUBIN UA: NEGATIVE
Blood, UA: NEGATIVE
GLUCOSE UA: NEGATIVE
KETONES UA: NEGATIVE
Nitrite, UA: NEGATIVE
Protein, UA: 30
Spec Grav, UA: 1.025
Urobilinogen, UA: 0.2
pH, UA: 6

## 2013-08-20 LAB — MICROALBUMIN / CREATININE URINE RATIO
CREATININE, U: 293.6 mg/dL
Microalb Creat Ratio: 3.3 mg/g (ref 0.0–30.0)
Microalb, Ur: 9.7 mg/dL — ABNORMAL HIGH (ref 0.0–1.9)

## 2013-08-20 MED ORDER — HYDROCHLOROTHIAZIDE 25 MG PO TABS: 25.0000 mg | ORAL_TABLET | Freq: Every day | ORAL | Status: DC

## 2013-08-20 NOTE — Addendum Note (Signed)
Addended by: Silvio Pate D on: 08/20/2013 04:52 PM   Modules accepted: Orders

## 2013-08-20 NOTE — Progress Notes (Signed)
Pre visit review using our clinic review tool, if applicable. No additional management support is needed unless otherwise documented below in the visit note. 

## 2013-08-20 NOTE — Progress Notes (Signed)
Subjective:    Patient ID: Julie [name], female    DOB: 1929-09-19, 80 y.o.   MRN: [id number]  HPI  HPI HYPERTENSION  Blood pressure range-good  Chest pain- no      Dyspnea- no Lightheadedness- no   Edema- yes Other side effects - no Medication compliance: good Low salt diet- yes  DIABETES  Blood Sugar ranges-130-150  Polyuria- no New Visual problems- no Hypoglycemic symptoms- no Other side effects-no Medication compliance - good Last eye exam- done per pt Foot exam- today  HYPERLIPIDEMIA  Medication compliance- good RUQ pain- no  Muscle aches- no Other side effects-no   Past Medical History  Diagnosis Date  . Hyperlipidemia   . Hypertension   . Osteopenia   . Gout   . Diabetes mellitus    History   Social History  . Marital Status: Widowed    Spouse Name: N/A    Number of Children: N/A  . Years of Education: N/A   Occupational History  . Not on file.   Social History Main Topics  . Smoking status: Never Smoker   . Smokeless tobacco: Not on file  . Alcohol Use: Yes  . Drug Use: No  . Sexual Activity:    Other Topics Concern  . Not on file   Social History Narrative  . No narrative on file   Family History  Problem Relation Age of Onset  . Stroke Other    Current Outpatient Prescriptions  Medication Sig Dispense Refill  . alendronate (FOSAMAX) 70 MG tablet Take 1 tablet (70 mg total) by mouth every 7 (seven) days. Take with a full glass of water on an empty stomach.  4 tablet  11  . allopurinol (ZYLOPRIM) 100 MG tablet Take 2 tabs by mouth daily--  180 tablet  3  . aspirin 81 MG tablet Take 81 mg by mouth daily.        . colchicine 0.6 MG tablet Take 0.6 mg by mouth as needed.       . Fenofibrate Micronized (ANTARA) 90 MG CAPS 1 po qd  30 capsule  0  . fish oil-omega-3 fatty acids 1000 MG capsule Take 2 g by mouth daily.        Marland Kitchen lovastatin (MEVACOR) 20 MG tablet Take 1 tablet (20 mg total) by mouth at bedtime.  90 tablet  3  .  metoprolol (LOPRESSOR) 100 MG tablet Take 1 tablet (100 mg total) by mouth 2 (two) times daily. Office visit due now  180 tablet  0  . ONE TOUCH ULTRA TEST test strip USE ONE STRIP TO CHECK GLUCOSE TWICE DAILY  100 each  2  . ONETOUCH DELICA LANCETS 33G MISC USE TO CHECK GLUCOSE TWICE DAILY  100 each  2  . hydrochlorothiazide (HYDRODIURIL) 25 MG tablet Take 1 tablet (25 mg total) by mouth daily.  30 tablet  11   No current facility-administered medications for this visit.       Review of Systems As above    Objective:   Physical Exam  BP 132/84  Pulse 57  Temp(Src) 98.1 F (36.7 C) (Oral)  Wt 141 lb (63.957 kg)  SpO2 97% General appearance: alert, cooperative, appears stated age and no distress Nose: Nares normal. Septum midline. Mucosa normal. No drainage or sinus tenderness. Throat: lips, mucosa, and tongue normal; teeth and gums normal Neck: no adenopathy, no carotid bruit, no JVD, supple, symmetrical, trachea midline and thyroid not enlarged, symmetric, no tenderness/mass/nodules Lungs: clear to auscultation  bilaterally Heart: S1, S2 normal Extremities: edema tr pitting edema Sensory exam of the foot is normal, tested with the monofilament. Good pulses, no lesions or ulcers, good peripheral pulses.       Assessment & Plan:  1. Type II or unspecified type diabetes mellitus with peripheral circulatory disorders, uncontrolled(250.72) Check labs, con't meds - Basic metabolic panel - Hemoglobin A1c - Microalbumin / creatinine urine ratio - POCT urinalysis dipstick  2. Other and unspecified hyperlipidemia Check labs - Hepatic function panel - Lipid panel - Microalbumin / creatinine urine ratio - POCT urinalysis dipstick  3. Essential hypertension Stable, con' tmeds - Basic metabolic panel - Microalbumin / creatinine urine ratio - POCT urinalysis dipstick - hydrochlorothiazide (HYDRODIURIL) 25 MG tablet; Take 1 tablet (25 mg total) by mouth daily.  Dispense: 30  tablet; Refill: 11  4. Edema Elevated extremities , compression sock.  Drink water - hydrochlorothiazide (HYDRODIURIL) 25 MG tablet; Take 1 tablet (25 mg total) by mouth daily.  Dispense: 30 tablet; Refill: 11

## 2013-08-24 LAB — URINE CULTURE: Colony Count: 70000

## 2013-08-27 ENCOUNTER — Ambulatory Visit
Admission: RE | Admit: 2013-08-27 | Discharge: 2013-08-27 | Disposition: A | Discharge status: Home / Self Care | Payer: Medicare Other | Source: Ambulatory Visit | Attending: Family Medicine | Admitting: Family Medicine

## 2013-08-27 DIAGNOSIS — N6489 Other specified disorders of breast: Secondary | ICD-10-CM | POA: Diagnosis not present

## 2013-08-27 DIAGNOSIS — R922 Inconclusive mammogram: Secondary | ICD-10-CM | POA: Diagnosis not present

## 2013-08-27 DIAGNOSIS — R928 Other abnormal and inconclusive findings on diagnostic imaging of breast: Secondary | ICD-10-CM

## 2013-08-28 DIAGNOSIS — H26499 Other secondary cataract, unspecified eye: Secondary | ICD-10-CM | POA: Diagnosis not present

## 2013-09-01 ENCOUNTER — Other Ambulatory Visit: Payer: Self-pay

## 2013-09-01 DIAGNOSIS — R809 Proteinuria, unspecified: Secondary | ICD-10-CM

## 2013-09-10 ENCOUNTER — Other Ambulatory Visit: Payer: Medicare Other

## 2013-09-10 DIAGNOSIS — R809 Proteinuria, unspecified: Secondary | ICD-10-CM | POA: Diagnosis not present

## 2013-09-12 LAB — PROTEIN, URINE, 24 HOUR
Protein, 24H Urine: 60 mg/d (ref <150)
Protein, Urine: 7 mg/dL (ref 5–24)

## 2013-11-04 DIAGNOSIS — Z23 Encounter for immunization: Secondary | ICD-10-CM | POA: Diagnosis not present

## 2013-11-21 ENCOUNTER — Telehealth: Payer: Self-pay | Admitting: Family Medicine

## 2013-11-21 DIAGNOSIS — E785 Hyperlipidemia, unspecified: Secondary | ICD-10-CM

## 2013-11-21 MED ORDER — METOPROLOL TARTRATE 100 MG PO TABS: 100.0000 mg | ORAL_TABLET | Freq: Two times a day (BID) | ORAL | Status: DC

## 2013-11-21 MED ORDER — LOVASTATIN 20 MG PO TABS: 20.0000 mg | ORAL_TABLET | Freq: Every day | ORAL | Status: DC

## 2013-11-21 NOTE — Telephone Encounter (Signed)
Rx faxed the patient is on Lovastatin and not Simvastatin.      KP

## 2013-11-21 NOTE — Telephone Encounter (Signed)
Simvastatin  metroprolol

## 2013-11-24 ENCOUNTER — Other Ambulatory Visit: Payer: Self-pay

## 2013-11-24 DIAGNOSIS — E785 Hyperlipidemia, unspecified: Secondary | ICD-10-CM

## 2013-11-24 MED ORDER — LOVASTATIN 20 MG PO TABS: 20.0000 mg | ORAL_TABLET | Freq: Every day | ORAL | Status: DC

## 2013-11-25 ENCOUNTER — Other Ambulatory Visit: Payer: Self-pay

## 2013-11-25 DIAGNOSIS — E785 Hyperlipidemia, unspecified: Secondary | ICD-10-CM

## 2013-11-25 MED ORDER — LOVASTATIN 20 MG PO TABS: 20.0000 mg | ORAL_TABLET | Freq: Every day | ORAL | Status: DC

## 2013-12-16 ENCOUNTER — Telehealth: Payer: Self-pay | Admitting: Family Medicine

## 2013-12-16 NOTE — Telephone Encounter (Signed)
Discussed with patient and reminded her that Dr.Lowne too her off of it in March due to the cost, she voiced understanding.      KP

## 2013-12-16 NOTE — Telephone Encounter (Signed)
Caller name: Willer, Barbin I Relation to pt: self  Call back number: [phone] Pharmacy:[facility] [phone]  Reason for call:  Pt would like to know the status of simvastatin (ZOCOR) 80 MG tablet is it D/C and if so what's the replacement please advise. (pt states please leave detail message)

## 2014-02-24 ENCOUNTER — Telehealth: Payer: Self-pay | Admitting: Family Medicine

## 2014-02-24 DIAGNOSIS — M109 Gout, unspecified: Secondary | ICD-10-CM

## 2014-02-24 MED ORDER — ALLOPURINOL 100 MG PO TABS: ORAL_TABLET | ORAL | Status: DC

## 2014-02-24 NOTE — Telephone Encounter (Signed)
Caller name: Ryllie Relation to pt: self Call back number: [phone] Pharmacy: [facility] [address]  Reason for call:   Requesting allopurinol refill

## 2014-02-24 NOTE — Telephone Encounter (Signed)
Rx faxed.    KP 

## 2014-03-20 ENCOUNTER — Encounter: Payer: Self-pay | Admitting: *Deleted

## 2014-05-07 ENCOUNTER — Telehealth: Payer: Self-pay | Admitting: *Deleted

## 2014-05-07 NOTE — Telephone Encounter (Signed)
Unable to reach patient at time of Pre-Visit Call.  Left message for patient to return call when available.    

## 2014-05-08 ENCOUNTER — Encounter: Payer: Self-pay | Admitting: Family Medicine

## 2014-05-08 ENCOUNTER — Ambulatory Visit (INDEPENDENT_AMBULATORY_CARE_PROVIDER_SITE_OTHER): Payer: Medicare Other | Admitting: Family Medicine

## 2014-05-08 VITALS — BP 130/80 | HR 59 | Temp 99.0°F | Ht <= 58 in | Wt 144.8 lb

## 2014-05-08 DIAGNOSIS — E119 Type 2 diabetes mellitus without complications: Secondary | ICD-10-CM

## 2014-05-08 DIAGNOSIS — E785 Hyperlipidemia, unspecified: Secondary | ICD-10-CM | POA: Diagnosis not present

## 2014-05-08 DIAGNOSIS — Z8739 Personal history of other diseases of the musculoskeletal system and connective tissue: Secondary | ICD-10-CM

## 2014-05-08 DIAGNOSIS — N390 Urinary tract infection, site not specified: Secondary | ICD-10-CM | POA: Diagnosis not present

## 2014-05-08 DIAGNOSIS — I10 Essential (primary) hypertension: Secondary | ICD-10-CM

## 2014-05-08 DIAGNOSIS — R609 Edema, unspecified: Secondary | ICD-10-CM | POA: Diagnosis not present

## 2014-05-08 DIAGNOSIS — Z8639 Personal history of other endocrine, nutritional and metabolic disease: Secondary | ICD-10-CM

## 2014-05-08 DIAGNOSIS — R82998 Other abnormal findings in urine: Secondary | ICD-10-CM

## 2014-05-08 DIAGNOSIS — Z0000 Encounter for general adult medical examination without abnormal findings: Secondary | ICD-10-CM

## 2014-05-08 DIAGNOSIS — Z23 Encounter for immunization: Secondary | ICD-10-CM

## 2014-05-08 LAB — BASIC METABOLIC PANEL
BUN: 17 mg/dL (ref 6–23)
CALCIUM: 9.8 mg/dL (ref 8.4–10.5)
CHLORIDE: 98 meq/L (ref 96–112)
CO2: 30 meq/L (ref 19–32)
Creatinine, Ser: 0.79 mg/dL (ref 0.40–1.20)
GFR: 73.99 mL/min (ref >60.00)
GLUCOSE: 133 mg/dL — AB (ref 70–99)
POTASSIUM: 3.9 meq/L (ref 3.5–5.1)
Sodium: 137 mEq/L (ref 135–145)

## 2014-05-08 LAB — LIPID PANEL
Cholesterol: 162 mg/dL (ref 0–200)
HDL: 58 mg/dL (ref >39.00)
LDL CALC: 72 mg/dL (ref 0–99)
NonHDL: 104
Total CHOL/HDL Ratio: 3
Triglycerides: 160 mg/dL — ABNORMAL HIGH (ref 0.0–149.0)
VLDL: 32 mg/dL (ref 0.0–40.0)

## 2014-05-08 LAB — CBC WITH DIFFERENTIAL/PLATELET
BASOS ABS: 0 10*3/uL (ref 0.0–0.1)
BASOS PCT: 0.4 % (ref 0.0–3.0)
Eosinophils Absolute: 0 10*3/uL (ref 0.0–0.7)
Eosinophils Relative: 0.8 % (ref 0.0–5.0)
HCT: 44.9 % (ref 36.0–46.0)
HEMOGLOBIN: 15.1 g/dL — AB (ref 12.0–15.0)
Lymphocytes Relative: 30.7 % (ref 12.0–46.0)
Lymphs Abs: 1.7 10*3/uL (ref 0.7–4.0)
MCHC: 33.7 g/dL (ref 30.0–36.0)
MCV: 95.1 fl (ref 78.0–100.0)
MONO ABS: 0.6 10*3/uL (ref 0.1–1.0)
MONOS PCT: 10.9 % (ref 3.0–12.0)
NEUTROS ABS: 3.2 10*3/uL (ref 1.4–7.7)
Neutrophils Relative %: 57.2 % (ref 43.0–77.0)
Platelets: 183 10*3/uL (ref 150.0–400.0)
RBC: 4.72 Mil/uL (ref 3.87–5.11)
RDW: 16.5 % — ABNORMAL HIGH (ref 11.5–15.5)
WBC: 5.7 10*3/uL (ref 4.0–10.5)

## 2014-05-08 LAB — MICROALBUMIN / CREATININE URINE RATIO
CREATININE, U: 98.9 mg/dL
MICROALB UR: 3.8 mg/dL — AB (ref 0.0–1.9)
MICROALB/CREAT RATIO: 3.8 mg/g (ref 0.0–30.0)

## 2014-05-08 LAB — POCT URINALYSIS DIPSTICK
Bilirubin, UA: NEGATIVE
Blood, UA: NEGATIVE
Glucose, UA: NEGATIVE
KETONES UA: NEGATIVE
Nitrite, UA: NEGATIVE
Protein, UA: NEGATIVE
Spec Grav, UA: 1.025
Urobilinogen, UA: NEGATIVE
pH, UA: 6

## 2014-05-08 LAB — HEPATIC FUNCTION PANEL
ALK PHOS: 63 U/L (ref 39–117)
ALT: 13 U/L (ref 0–35)
AST: 21 U/L (ref 0–37)
Albumin: 4.1 g/dL (ref 3.5–5.2)
BILIRUBIN DIRECT: 0.2 mg/dL (ref 0.0–0.3)
BILIRUBIN TOTAL: 0.8 mg/dL (ref 0.2–1.2)
TOTAL PROTEIN: 7 g/dL (ref 6.0–8.3)

## 2014-05-08 LAB — HEMOGLOBIN A1C: Hgb A1c MFr Bld: 6.3 % (ref 4.6–6.5)

## 2014-05-08 LAB — URIC ACID: URIC ACID, SERUM: 4 mg/dL (ref 2.4–7.0)

## 2014-05-08 MED ORDER — METOPROLOL TARTRATE 100 MG PO TABS: 100.0000 mg | ORAL_TABLET | Freq: Two times a day (BID) | ORAL | Status: DC

## 2014-05-08 MED ORDER — HYDROCHLOROTHIAZIDE 25 MG PO TABS: 25.0000 mg | ORAL_TABLET | Freq: Every day | ORAL | Status: DC

## 2014-05-08 MED ORDER — ZOSTER VACCINE LIVE 19400 UNT/0.65ML ~~LOC~~ SOLR: 0.6500 mL | Freq: Once | SUBCUTANEOUS | Status: DC

## 2014-05-08 MED ORDER — TETANUS-DIPHTH-ACELL PERTUSSIS 5-2.5-18.5 LF-MCG/0.5 IM SUSP: 0.5000 mL | Freq: Once | INTRAMUSCULAR | Status: DC

## 2014-05-08 NOTE — Patient Instructions (Signed)
Preventive Care for Adults A healthy lifestyle and preventive care can promote health and wellness. Preventive health guidelines for women include the following key practices.  A routine yearly physical is a good way to check with your health care provider about your health and preventive screening. It is a chance to share any concerns and updates on your health and to receive a thorough exam.  Visit your dentist for a routine exam and preventive care every 6 months. Brush your teeth twice a day and floss once a day. Good oral hygiene prevents tooth decay and gum disease.  The frequency of eye exams is based on your age, health, family medical history, use of contact lenses, and other factors. Follow your health care provider's recommendations for frequency of eye exams.  Eat a healthy diet. Foods like vegetables, fruits, whole grains, low-fat dairy products, and lean protein foods contain the nutrients you need without too many calories. Decrease your intake of foods high in solid fats, added sugars, and salt. Eat the right amount of calories for you.Get information about a proper diet from your health care provider, if necessary.  Regular physical exercise is one of the most important things you can do for your health. Most adults should get at least 150 minutes of moderate-intensity exercise (any activity that increases your heart rate and causes you to sweat) each week. In addition, most adults need muscle-strengthening exercises on 2 or more days a week.  Maintain a healthy weight. The body mass index (BMI) is a screening tool to identify possible weight problems. It provides an estimate of body fat based on height and weight. Your health care provider can find your BMI and can help you achieve or maintain a healthy weight.For adults 20 years and older:  A BMI below 18.5 is considered underweight.  A BMI of 18.5 to 24.9 is normal.  A BMI of 25 to 29.9 is considered overweight.  A BMI of  30 and above is considered obese.  Maintain normal blood lipids and cholesterol levels by exercising and minimizing your intake of saturated fat. Eat a balanced diet with plenty of fruit and vegetables. Blood tests for lipids and cholesterol should begin at age 76 and be repeated every 5 years. If your lipid or cholesterol levels are high, you are over 50, or you are at high risk for heart disease, you may need your cholesterol levels checked more frequently.Ongoing high lipid and cholesterol levels should be treated with medicines if diet and exercise are not working.  If you smoke, find out from your health care provider how to quit. If you do not use tobacco, do not start.  Lung cancer screening is recommended for adults aged 22-80 years who are at high risk for developing lung cancer because of a history of smoking. A yearly low-dose CT scan of the lungs is recommended for people who have at least a 30-pack-year history of smoking and are a current smoker or have quit within the past 15 years. A pack year of smoking is smoking an average of 1 pack of cigarettes a day for 1 year (for example: 1 pack a day for 30 years or 2 packs a day for 15 years). Yearly screening should continue until the smoker has stopped smoking for at least 15 years. Yearly screening should be stopped for people who develop a health problem that would prevent them from having lung cancer treatment.  If you are pregnant, do not drink alcohol. If you are breastfeeding,  be very cautious about drinking alcohol. If you are not pregnant and choose to drink alcohol, do not have more than 1 drink per day. One drink is considered to be 12 ounces (355 mL) of beer, 5 ounces (148 mL) of wine, or 1.5 ounces (44 mL) of liquor.  Avoid use of street drugs. Do not share needles with anyone. Ask for help if you need support or instructions about stopping the use of drugs.  High blood pressure causes heart disease and increases the risk of  stroke. Your blood pressure should be checked at least every 1 to 2 years. Ongoing high blood pressure should be treated with medicines if weight loss and exercise do not work.  If you are 3-86 years old, ask your health care provider if you should take aspirin to prevent strokes.  Diabetes screening involves taking a blood sample to check your fasting blood sugar level. This should be done once every 3 years, after age 67, if you are within normal weight and without risk factors for diabetes. Testing should be considered at a younger age or be carried out more frequently if you are overweight and have at least 1 risk factor for diabetes.  Breast cancer screening is essential preventive care for women. You should practice "breast self-awareness." This means understanding the normal appearance and feel of your breasts and may include breast self-examination. Any changes detected, no matter how small, should be reported to a health care provider. Women in their 8s and 30s should have a clinical breast exam (CBE) by a health care provider as part of a regular health exam every 1 to 3 years. After age 70, women should have a CBE every year. Starting at age 25, women should consider having a mammogram (breast X-ray test) every year. Women who have a family history of breast cancer should talk to their health care provider about genetic screening. Women at a high risk of breast cancer should talk to their health care providers about having an MRI and a mammogram every year.  Breast cancer gene (BRCA)-related cancer risk assessment is recommended for women who have family members with BRCA-related cancers. BRCA-related cancers include breast, ovarian, tubal, and peritoneal cancers. Having family members with these cancers may be associated with an increased risk for harmful changes (mutations) in the breast cancer genes BRCA1 and BRCA2. Results of the assessment will determine the need for genetic counseling and  BRCA1 and BRCA2 testing.  Routine pelvic exams to screen for cancer are no longer recommended for nonpregnant women who are considered low risk for cancer of the pelvic organs (ovaries, uterus, and vagina) and who do not have symptoms. Ask your health care provider if a screening pelvic exam is right for you.  If you have had past treatment for cervical cancer or a condition that could lead to cancer, you need Pap tests and screening for cancer for at least 20 years after your treatment. If Pap tests have been discontinued, your risk factors (such as having a new sexual partner) need to be reassessed to determine if screening should be resumed. Some women have medical problems that increase the chance of getting cervical cancer. In these cases, your health care provider may recommend more frequent screening and Pap tests.  The HPV test is an additional test that may be used for cervical cancer screening. The HPV test looks for the virus that can cause the cell changes on the cervix. The cells collected during the Pap test can be  tested for HPV. The HPV test could be used to screen women aged 30 years and older, and should be used in women of any age who have unclear Pap test results. After the age of 30, women should have HPV testing at the same frequency as a Pap test.  Colorectal cancer can be detected and often prevented. Most routine colorectal cancer screening begins at the age of 50 years and continues through age 75 years. However, your health care provider may recommend screening at an earlier age if you have risk factors for colon cancer. On a yearly basis, your health care provider may provide home test kits to check for hidden blood in the stool. Use of a small camera at the end of a tube, to directly examine the colon (sigmoidoscopy or colonoscopy), can detect the earliest forms of colorectal cancer. Talk to your health care provider about this at age 50, when routine screening begins. Direct  exam of the colon should be repeated every 5-10 years through age 75 years, unless early forms of pre-cancerous polyps or small growths are found.  People who are at an increased risk for hepatitis B should be screened for this virus. You are considered at high risk for hepatitis B if:  You were born in a country where hepatitis B occurs often. Talk with your health care provider about which countries are considered high risk.  Your parents were born in a high-risk country and you have not received a shot to protect against hepatitis B (hepatitis B vaccine).  You have HIV or AIDS.  You use needles to inject street drugs.  You live with, or have sex with, someone who has hepatitis B.  You get hemodialysis treatment.  You take certain medicines for conditions like cancer, organ transplantation, and autoimmune conditions.  Hepatitis C blood testing is recommended for all people born from 1945 through 1965 and any individual with known risks for hepatitis C.  Practice safe sex. Use condoms and avoid high-risk sexual practices to reduce the spread of sexually transmitted infections (STIs). STIs include gonorrhea, chlamydia, syphilis, trichomonas, herpes, HPV, and human immunodeficiency virus (HIV). Herpes, HIV, and HPV are viral illnesses that have no cure. They can result in disability, cancer, and death.  You should be screened for sexually transmitted illnesses (STIs) including gonorrhea and chlamydia if:  You are sexually active and are younger than 24 years.  You are older than 24 years and your health care provider tells you that you are at risk for this type of infection.  Your sexual activity has changed since you were last screened and you are at an increased risk for chlamydia or gonorrhea. Ask your health care provider if you are at risk.  If you are at risk of being infected with HIV, it is recommended that you take a prescription medicine daily to prevent HIV infection. This is  called preexposure prophylaxis (PrEP). You are considered at risk if:  You are a heterosexual woman, are sexually active, and are at increased risk for HIV infection.  You take drugs by injection.  You are sexually active with a partner who has HIV.  Talk with your health care provider about whether you are at high risk of being infected with HIV. If you choose to begin PrEP, you should first be tested for HIV. You should then be tested every 3 months for as long as you are taking PrEP.  Osteoporosis is a disease in which the bones lose minerals and strength   with aging. This can result in serious bone fractures or breaks. The risk of osteoporosis can be identified using a bone density scan. Women ages 65 years and over and women at risk for fractures or osteoporosis should discuss screening with their health care providers. Ask your health care provider whether you should take a calcium supplement or vitamin D to reduce the rate of osteoporosis.  Menopause can be associated with physical symptoms and risks. Hormone replacement therapy is available to decrease symptoms and risks. You should talk to your health care provider about whether hormone replacement therapy is right for you.  Use sunscreen. Apply sunscreen liberally and repeatedly throughout the day. You should seek shade when your shadow is shorter than you. Protect yourself by wearing long sleeves, pants, a wide-brimmed hat, and sunglasses year round, whenever you are outdoors.  Once a month, do a whole body skin exam, using a mirror to look at the skin on your back. Tell your health care provider of new moles, moles that have irregular borders, moles that are larger than a pencil eraser, or moles that have changed in shape or color.  Stay current with required vaccines (immunizations).  Influenza vaccine. All adults should be immunized every year.  Tetanus, diphtheria, and acellular pertussis (Td, Tdap) vaccine. Pregnant women should  receive 1 dose of Tdap vaccine during each pregnancy. The dose should be obtained regardless of the length of time since the last dose. Immunization is preferred during the 27th-36th week of gestation. An adult who has not previously received Tdap or who does not know her vaccine status should receive 1 dose of Tdap. This initial dose should be followed by tetanus and diphtheria toxoids (Td) booster doses every 10 years. Adults with an unknown or incomplete history of completing a 3-dose immunization series with Td-containing vaccines should begin or complete a primary immunization series including a Tdap dose. Adults should receive a Td booster every 10 years.  Varicella vaccine. An adult without evidence of immunity to varicella should receive 2 doses or a second dose if she has previously received 1 dose. Pregnant females who do not have evidence of immunity should receive the first dose after pregnancy. This first dose should be obtained before leaving the health care facility. The second dose should be obtained 4-8 weeks after the first dose.  Human papillomavirus (HPV) vaccine. Females aged 13-26 years who have not received the vaccine previously should obtain the 3-dose series. The vaccine is not recommended for use in pregnant females. However, pregnancy testing is not needed before receiving a dose. If a female is found to be pregnant after receiving a dose, no treatment is needed. In that case, the remaining doses should be delayed until after the pregnancy. Immunization is recommended for any person with an immunocompromised condition through the age of 26 years if she did not get any or all doses earlier. During the 3-dose series, the second dose should be obtained 4-8 weeks after the first dose. The third dose should be obtained 24 weeks after the first dose and 16 weeks after the second dose.  Zoster vaccine. One dose is recommended for adults aged 60 years or older unless certain conditions are  present.  Measles, mumps, and rubella (MMR) vaccine. Adults born before 1957 generally are considered immune to measles and mumps. Adults born in 1957 or later should have 1 or more doses of MMR vaccine unless there is a contraindication to the vaccine or there is laboratory evidence of immunity to   each of the three diseases. A routine second dose of MMR vaccine should be obtained at least 28 days after the first dose for students attending postsecondary schools, health care workers, or international travelers. People who received inactivated measles vaccine or an unknown type of measles vaccine during 1963-1967 should receive 2 doses of MMR vaccine. People who received inactivated mumps vaccine or an unknown type of mumps vaccine before 1979 and are at high risk for mumps infection should consider immunization with 2 doses of MMR vaccine. For females of childbearing age, rubella immunity should be determined. If there is no evidence of immunity, females who are not pregnant should be vaccinated. If there is no evidence of immunity, females who are pregnant should delay immunization until after pregnancy. Unvaccinated health care workers born before 1957 who lack laboratory evidence of measles, mumps, or rubella immunity or laboratory confirmation of disease should consider measles and mumps immunization with 2 doses of MMR vaccine or rubella immunization with 1 dose of MMR vaccine.  Pneumococcal 13-valent conjugate (PCV13) vaccine. When indicated, a person who is uncertain of her immunization history and has no record of immunization should receive the PCV13 vaccine. An adult aged 19 years or older who has certain medical conditions and has not been previously immunized should receive 1 dose of PCV13 vaccine. This PCV13 should be followed with a dose of pneumococcal polysaccharide (PPSV23) vaccine. The PPSV23 vaccine dose should be obtained at least 8 weeks after the dose of PCV13 vaccine. An adult aged 19  years or older who has certain medical conditions and previously received 1 or more doses of PPSV23 vaccine should receive 1 dose of PCV13. The PCV13 vaccine dose should be obtained 1 or more years after the last PPSV23 vaccine dose.  Pneumococcal polysaccharide (PPSV23) vaccine. When PCV13 is also indicated, PCV13 should be obtained first. All adults aged 65 years and older should be immunized. An adult younger than age 65 years who has certain medical conditions should be immunized. Any person who resides in a nursing home or long-term care facility should be immunized. An adult smoker should be immunized. People with an immunocompromised condition and certain other conditions should receive both PCV13 and PPSV23 vaccines. People with human immunodeficiency virus (HIV) infection should be immunized as soon as possible after diagnosis. Immunization during chemotherapy or radiation therapy should be avoided. Routine use of PPSV23 vaccine is not recommended for American Indians, [place] Natives, or people younger than 65 years unless there are medical conditions that require PPSV23 vaccine. When indicated, people who have unknown immunization and have no record of immunization should receive PPSV23 vaccine. One-time revaccination 5 years after the first dose of PPSV23 is recommended for people aged 19-64 years who have chronic kidney failure, nephrotic syndrome, asplenia, or immunocompromised conditions. People who received 1-2 doses of PPSV23 before age 65 years should receive another dose of PPSV23 vaccine at age 65 years or later if at least 5 years have passed since the previous dose. Doses of PPSV23 are not needed for people immunized with PPSV23 at or after age 65 years.  Meningococcal vaccine. Adults with asplenia or persistent complement component deficiencies should receive 2 doses of quadrivalent meningococcal conjugate (MenACWY-D) vaccine. The doses should be obtained at least 2 months apart.  Microbiologists working with certain meningococcal bacteria, military recruits, people at risk during an outbreak, and people who travel to or live in countries with a high rate of meningitis should be immunized. A first-year college student up through age   21 years who is living in a residence hall should receive a dose if she did not receive a dose on or after her 16th birthday. Adults who have certain high-risk conditions should receive one or more doses of vaccine.  Hepatitis A vaccine. Adults who wish to be protected from this disease, have certain high-risk conditions, work with hepatitis A-infected animals, work in hepatitis A research labs, or travel to or work in countries with a high rate of hepatitis A should be immunized. Adults who were previously unvaccinated and who anticipate close contact with an international adoptee during the first 60 days after arrival in the [place] from a country with a high rate of hepatitis A should be immunized.  Hepatitis B vaccine. Adults who wish to be protected from this disease, have certain high-risk conditions, may be exposed to blood or other infectious body fluids, are household contacts or sex partners of hepatitis B positive people, are clients or workers in certain care facilities, or travel to or work in countries with a high rate of hepatitis B should be immunized.  Haemophilus influenzae type b (Hib) vaccine. A previously unvaccinated person with asplenia or sickle cell disease or having a scheduled splenectomy should receive 1 dose of Hib vaccine. Regardless of previous immunization, a recipient of a hematopoietic stem cell transplant should receive a 3-dose series 6-12 months after her successful transplant. Hib vaccine is not recommended for adults with HIV infection. Preventive Services / Frequency Ages 64 to 68 years  Blood pressure check.** / Every 1 to 2 years.  Lipid and cholesterol check.** / Every 5 years beginning at age  22.  Clinical breast exam.** / Every 3 years for women in their 88s and 53s.  BRCA-related cancer risk assessment.** / For women who have family members with a BRCA-related cancer (breast, ovarian, tubal, or peritoneal cancers).  Pap test.** / Every 2 years from ages 90 through 51. Every 3 years starting at age 21 through age 56 or 3 with a history of 3 consecutive normal Pap tests.  HPV screening.** / Every 3 years from ages 24 through ages 1 to 46 with a history of 3 consecutive normal Pap tests.  Hepatitis C blood test.** / For any individual with known risks for hepatitis C.  Skin self-exam. / Monthly.  Influenza vaccine. / Every year.  Tetanus, diphtheria, and acellular pertussis (Tdap, Td) vaccine.** / Consult your health care provider. Pregnant women should receive 1 dose of Tdap vaccine during each pregnancy. 1 dose of Td every 10 years.  Varicella vaccine.** / Consult your health care provider. Pregnant females who do not have evidence of immunity should receive the first dose after pregnancy.  HPV vaccine. / 3 doses over 6 months, if 72 and younger. The vaccine is not recommended for use in pregnant females. However, pregnancy testing is not needed before receiving a dose.  Measles, mumps, rubella (MMR) vaccine.** / You need at least 1 dose of MMR if you were born in 1957 or later. You may also need a 2nd dose. For females of childbearing age, rubella immunity should be determined. If there is no evidence of immunity, females who are not pregnant should be vaccinated. If there is no evidence of immunity, females who are pregnant should delay immunization until after pregnancy.  Pneumococcal 13-valent conjugate (PCV13) vaccine.** / Consult your health care provider.  Pneumococcal polysaccharide (PPSV23) vaccine.** / 1 to 2 doses if you smoke cigarettes or if you have certain conditions.  Meningococcal vaccine.** /  1 dose if you are age 19 to 21 years and a first-year college  student living in a residence hall, or have one of several medical conditions, you need to get vaccinated against meningococcal disease. You may also need additional booster doses.  Hepatitis A vaccine.** / Consult your health care provider.  Hepatitis B vaccine.** / Consult your health care provider.  Haemophilus influenzae type b (Hib) vaccine.** / Consult your health care provider. Ages 40 to 64 years  Blood pressure check.** / Every 1 to 2 years.  Lipid and cholesterol check.** / Every 5 years beginning at age 20 years.  Lung cancer screening. / Every year if you are aged 55-80 years and have a 30-pack-year history of smoking and currently smoke or have quit within the past 15 years. Yearly screening is stopped once you have quit smoking for at least 15 years or develop a health problem that would prevent you from having lung cancer treatment.  Clinical breast exam.** / Every year after age 40 years.  BRCA-related cancer risk assessment.** / For women who have family members with a BRCA-related cancer (breast, ovarian, tubal, or peritoneal cancers).  Mammogram.** / Every year beginning at age 40 years and continuing for as long as you are in good health. Consult with your health care provider.  Pap test.** / Every 3 years starting at age 30 years through age 65 or 70 years with a history of 3 consecutive normal Pap tests.  HPV screening.** / Every 3 years from ages 30 years through ages 65 to 70 years with a history of 3 consecutive normal Pap tests.  Fecal occult blood test (FOBT) of stool. / Every year beginning at age 50 years and continuing until age 75 years. You may not need to do this test if you get a colonoscopy every 10 years.  Flexible sigmoidoscopy or colonoscopy.** / Every 5 years for a flexible sigmoidoscopy or every 10 years for a colonoscopy beginning at age 50 years and continuing until age 75 years.  Hepatitis C blood test.** / For all people born from 1945 through  1965 and any individual with known risks for hepatitis C.  Skin self-exam. / Monthly.  Influenza vaccine. / Every year.  Tetanus, diphtheria, and acellular pertussis (Tdap/Td) vaccine.** / Consult your health care provider. Pregnant women should receive 1 dose of Tdap vaccine during each pregnancy. 1 dose of Td every 10 years.  Varicella vaccine.** / Consult your health care provider. Pregnant females who do not have evidence of immunity should receive the first dose after pregnancy.  Zoster vaccine.** / 1 dose for adults aged 60 years or older.  Measles, mumps, rubella (MMR) vaccine.** / You need at least 1 dose of MMR if you were born in 1957 or later. You may also need a 2nd dose. For females of childbearing age, rubella immunity should be determined. If there is no evidence of immunity, females who are not pregnant should be vaccinated. If there is no evidence of immunity, females who are pregnant should delay immunization until after pregnancy.  Pneumococcal 13-valent conjugate (PCV13) vaccine.** / Consult your health care provider.  Pneumococcal polysaccharide (PPSV23) vaccine.** / 1 to 2 doses if you smoke cigarettes or if you have certain conditions.  Meningococcal vaccine.** / Consult your health care provider.  Hepatitis A vaccine.** / Consult your health care provider.  Hepatitis B vaccine.** / Consult your health care provider.  Haemophilus influenzae type b (Hib) vaccine.** / Consult your health care provider. Ages 65   years and over  Blood pressure check.** / Every 1 to 2 years.  Lipid and cholesterol check.** / Every 5 years beginning at age 22 years.  Lung cancer screening. / Every year if you are aged 73-80 years and have a 30-pack-year history of smoking and currently smoke or have quit within the past 15 years. Yearly screening is stopped once you have quit smoking for at least 15 years or develop a health problem that would prevent you from having lung cancer  treatment.  Clinical breast exam.** / Every year after age 4 years.  BRCA-related cancer risk assessment.** / For women who have family members with a BRCA-related cancer (breast, ovarian, tubal, or peritoneal cancers).  Mammogram.** / Every year beginning at age 40 years and continuing for as long as you are in good health. Consult with your health care provider.  Pap test.** / Every 3 years starting at age 9 years through age 34 or 91 years with 3 consecutive normal Pap tests. Testing can be stopped between 65 and 70 years with 3 consecutive normal Pap tests and no abnormal Pap or HPV tests in the past 10 years.  HPV screening.** / Every 3 years from ages 57 years through ages 64 or 45 years with a history of 3 consecutive normal Pap tests. Testing can be stopped between 65 and 70 years with 3 consecutive normal Pap tests and no abnormal Pap or HPV tests in the past 10 years.  Fecal occult blood test (FOBT) of stool. / Every year beginning at age 15 years and continuing until age 17 years. You may not need to do this test if you get a colonoscopy every 10 years.  Flexible sigmoidoscopy or colonoscopy.** / Every 5 years for a flexible sigmoidoscopy or every 10 years for a colonoscopy beginning at age 86 years and continuing until age 71 years.  Hepatitis C blood test.** / For all people born from [birth year] through 1963 and any individual with known risks for hepatitis C.  Osteoporosis screening.** / A one-time screening for women ages 83 years and over and women at risk for fractures or osteoporosis.  Skin self-exam. / Monthly.  Influenza vaccine. / Every year.  Tetanus, diphtheria, and acellular pertussis (Tdap/Td) vaccine.** / 1 dose of Td every 10 years.  Varicella vaccine.** / Consult your health care provider.  Zoster vaccine.** / 1 dose for adults aged 61 years or older.  Pneumococcal 13-valent conjugate (PCV13) vaccine.** / Consult your health care provider.  Pneumococcal  polysaccharide (PPSV23) vaccine.** / 1 dose for all adults aged 28 years and older.  Meningococcal vaccine.** / Consult your health care provider.  Hepatitis A vaccine.** / Consult your health care provider.  Hepatitis B vaccine.** / Consult your health care provider.  Haemophilus influenzae type b (Hib) vaccine.** / Consult your health care provider. ** Family history and personal history of risk and conditions may change your health care provider's recommendations. Document Released: 02/21/2001 Document Revised: 05/12/2013 Document Reviewed: 05/23/2010 [facility] Patient Information 2015 [place], [place]. This information is not intended to replace advice given to you by your health care provider. Make sure you discuss any questions you have with your health care provider.

## 2014-05-08 NOTE — Progress Notes (Signed)
Pre visit review using our clinic review tool, if applicable. No additional management support is needed unless otherwise documented below in the visit note. 

## 2014-05-08 NOTE — Progress Notes (Signed)
Subjective:   JANNICE FAIVRE is a 81 y.o. female who presents for Medicare Annual (Subsequent) preventive examination.  Review of Systems:   Review of Systems  Constitutional: Negative for activity change, appetite change and fatigue.  HENT: Negative for hearing loss, congestion, tinnitus and ear discharge.   Eyes: Negative for visual disturbance (see optho q1y -- vision corrected to 20/20 with glasses).  Respiratory: Negative for cough, chest tightness and shortness of breath.   Cardiovascular: Negative for chest pain, palpitations and leg swelling.  Gastrointestinal: Negative for abdominal pain, diarrhea, constipation and abdominal distention.  Genitourinary: Negative for urgency, frequency, decreased urine volume and difficulty urinating.  Musculoskeletal: Negative for back pain, arthralgias and gait problem.  Skin: Negative for color change, pallor and rash.  Neurological: Negative for dizziness, light-headedness, numbness and headaches.  Hematological: Negative for adenopathy. Does not bruise/bleed easily.  Psychiatric/Behavioral: Negative for suicidal ideas, confusion, sleep disturbance, self-injury, dysphoric mood, decreased concentration and agitation.  Pt is able to read and write and can do all ADLs No risk for falling No abuse/ violence in home          Objective:     Vitals: BP 130/80 mmHg  Pulse 59  Temp(Src) 99 F (37.2 C) (Oral)  Ht 4\' 10"  (1.473 m)  Wt 144 lb 12.8 oz (65.681 kg)  BMI 30.27 kg/m2  SpO2 96% BP 130/80 mmHg  Pulse 59  Temp(Src) 99 F (37.2 C) (Oral)  Ht 4\' 10"  (1.473 m)  Wt 144 lb 12.8 oz (65.681 kg)  BMI 30.27 kg/m2  SpO2 96% General appearance: alert, cooperative, appears stated age and no distress Head: Normocephalic, without obvious abnormality, atraumatic Eyes: conjunctivae/corneas clear. PERRL, EOM's intact. Fundi benign. Ears: normal TM's and external ear canals both ears Nose: Nares normal. Septum midline. Mucosa normal. No  drainage or sinus tenderness. Throat: lips, mucosa, and tongue normal; teeth and gums normal Neck: no adenopathy, no carotid bruit, no JVD, supple, symmetrical, trachea midline and thyroid not enlarged, symmetric, no tenderness/mass/nodules Back: symmetric, no curvature. ROM normal. No CVA tenderness. Lungs: clear to auscultation bilaterally Breasts: normal appearance, no masses or tenderness Heart: regular rate and rhythm, S1, S2 normal, no murmur, click, rub or gallop Abdomen: soft, non-tender; bowel sounds normal; no masses,  no organomegaly Extremities: extremities normal, atraumatic, no cyanosis or edema Pulses: 2+ and symmetric Skin: Skin color, texture, turgor normal. No rashes or lesions Lymph nodes: Cervical, supraclavicular, and axillary nodes normal. Neurologic: Alert and oriented X 3, normal strength and tone. Normal symmetric reflexes. Normal coordination and gait psych Tobacco History  Smoking status  . Never Smoker   Smokeless tobacco  . Not on file     Counseling given: Not Answered   Past Medical History  Diagnosis Date  . Hyperlipidemia   . Hypertension   . Osteopenia   . Gout   . Diabetes mellitus    Past Surgical History  Procedure Laterality Date  . Cataract extraction    . Cataract extraction Left 7/15   Family History  Problem Relation Age of Onset  . Stroke Other    History  Sexual Activity  . Sexual Activity: Not on file    Outpatient Encounter Prescriptions as of 05/08/2014  Medication Sig  . allopurinol (ZYLOPRIM) 100 MG tablet Take 2 tabs by mouth daily--  . aspirin 81 MG tablet Take 81 mg by mouth daily.    . colchicine 0.6 MG tablet Take 0.6 mg by mouth as needed.   . fish  oil-omega-3 fatty acids 1000 MG capsule Take 2 g by mouth daily.    . hydrochlorothiazide (HYDRODIURIL) 25 MG tablet Take 1 tablet (25 mg total) by mouth daily.  Marland Kitchen lovastatin (MEVACOR) 20 MG tablet Take 1 tablet (20 mg total) by mouth at bedtime.  . metoprolol  (LOPRESSOR) 100 MG tablet Take 1 tablet (100 mg total) by mouth 2 (two) times daily.  . ONE TOUCH ULTRA TEST test strip USE ONE STRIP TO CHECK GLUCOSE TWICE DAILY  . ONETOUCH DELICA LANCETS 33G MISC USE TO CHECK GLUCOSE TWICE DAILY  . [DISCONTINUED] alendronate (FOSAMAX) 70 MG tablet Take 1 tablet (70 mg total) by mouth every 7 (seven) days. Take with a full glass of water on an empty stomach.  . [DISCONTINUED] Fenofibrate Micronized (ANTARA) 90 MG CAPS 1 po qd  . [DISCONTINUED] hydrochlorothiazide (HYDRODIURIL) 25 MG tablet Take 1 tablet (25 mg total) by mouth daily.  . [DISCONTINUED] metoprolol (LOPRESSOR) 100 MG tablet Take 1 tablet (100 mg total) by mouth 2 (two) times daily.  . Tdap (BOOSTRIX) 5-2.5-18.5 LF-MCG/0.5 injection Inject 0.5 mLs into the muscle once.  . zoster vaccine live, PF, (ZOSTAVAX) [address] UNT/0.65ML injection Inject 19,400 Units into the skin once.    Activities of Daily Living In your present state of health, do you have any difficulty performing the following activities: 05/08/2014  Hearing? N  Vision? N  Difficulty concentrating or making decisions? N  Walking or climbing stairs? N  Dressing or bathing? N  Doing errands, shopping? N    Patient Care Team: Lelon Perla, DO as PCP - General    Assessment:    CPE Exercise Activities and Dietary recommendations---  con't to walk dog 2-3 x a day     Goals    None     Fall Risk Fall Risk  05/08/2014 01/24/2013  Falls in the past year? No No   Depression Screen PHQ 2/9 Scores 05/08/2014 01/24/2013 11/06/2011  PHQ - 2 Score 0 0 1     Cognitive Testing No flowsheet data found.  Immunization History  Administered Date(s) Administered  . Influenza Whole 11/30/2006, 09/30/2007, 10/14/2008, 10/07/2009, 10/03/2010, 10/12/2011  . Influenza-Unspecified 10/09/2012, 10/03/2013  . Pneumococcal Polysaccharide-23 10/28/2008   Screening Tests Health Maintenance  Topic Date Due  . PNA vac Low Risk Adult (2 of 2 -  PCV13) 10/28/2009  . FOOT EXAM  11/05/2012  . HEMOGLOBIN A1C  02/20/2014  . ZOSTAVAX  08/07/2014 (Originally 01/08/1992)  . TETANUS/TDAP  08/07/2014 (Originally 01/08/1949)  . OPHTHALMOLOGY EXAM  08/07/2014  . INFLUENZA VACCINE  08/10/2014  . URINE MICROALBUMIN  08/21/2014  . MAMMOGRAM  08/28/2014      Plan:    check labs ghm utd--- pt not wanting to do colonoscopy, bmd or mammogram ---due to age During the course of the visit the patient was educated and counseled about the following appropriate screening and preventive services:   Vaccines to include Pneumoccal, Influenza, Hepatitis B, Td, Zostavax, HCV  Electrocardiogram  Cardiovascular Disease  Colorectal cancer screening  Bone density screening  Diabetes screening  Glaucoma screening  Mammography/PAP  Nutrition counseling   Patient Instructions (the written plan) was given to the patient.  1. Edema  - hydrochlorothiazide (HYDRODIURIL) 25 MG tablet; Take 1 tablet (25 mg total) by mouth daily.  Dispense: 90 tablet; Refill: 3  2. Essential hypertension stable - hydrochlorothiazide (HYDRODIURIL) 25 MG tablet; Take 1 tablet (25 mg total) by mouth daily.  Dispense: 90 tablet; Refill: 3 - Basic metabolic panel -  CBC with Differential/Platelet  3. Need for shingles vaccine  - zoster vaccine live, PF, (ZOSTAVAX) 09811 UNT/0.65ML injection; Inject 19,400 Units into the skin once.  Dispense: 1 each; Refill: 0  4. Need for Tdap vaccination  - Tdap (BOOSTRIX) 5-2.5-18.5 LF-MCG/0.5 injection; Inject 0.5 mLs into the muscle once.  Dispense: 0.5 mL; Refill: 0  5. Hyperlipidemia con't lovastatin - Hepatic function panel - Lipid panel - POCT urinalysis dipstick  6. Hx of gout   - Uric acid  7. Medicare annual wellness visit, subsequent    8. Diabetes mellitus type II, controlled   - Microalbumin / creatinine urine ratio - Hemoglobin A1c  9. Routine history and physical examination of adult    10.  Leukocytes in urine  - Urine Culture  Loreen Freud, DO  05/08/2014

## 2014-05-10 LAB — URINE CULTURE: Colony Count: >=100000

## 2014-05-13 ENCOUNTER — Telehealth: Payer: Self-pay

## 2014-05-13 NOTE — Telephone Encounter (Signed)
-----   Message from Lelon Perla, DO sent at 05/13/2014  1:14 PM EDT ----- + bacteria in urine but pt asymptomatic---- no treatment Rest of labs are good Recheck 6 months----lipid, hep, bmp, hgba1c--- dm II , hyperlipidemia

## 2014-05-13 NOTE — Telephone Encounter (Signed)
Pt notified verbalized understanding no questions or concerns at this time.

## 2014-11-10 DIAGNOSIS — Z23 Encounter for immunization: Secondary | ICD-10-CM | POA: Diagnosis not present

## 2014-11-25 ENCOUNTER — Other Ambulatory Visit: Payer: Self-pay | Admitting: Family Medicine

## 2015-01-12 ENCOUNTER — Other Ambulatory Visit: Payer: Self-pay | Admitting: Family Medicine

## 2015-03-01 ENCOUNTER — Other Ambulatory Visit: Payer: Self-pay | Admitting: Family Medicine

## 2015-03-04 ENCOUNTER — Other Ambulatory Visit: Payer: Self-pay

## 2015-03-04 MED ORDER — GLUCOSE BLOOD VI STRP: ORAL_STRIP | Status: DC

## 2015-03-08 ENCOUNTER — Other Ambulatory Visit: Payer: Self-pay

## 2015-03-08 MED ORDER — ONETOUCH DELICA LANCETS 33G MISC: Status: DC

## 2015-03-11 ENCOUNTER — Other Ambulatory Visit: Payer: Self-pay

## 2015-03-11 MED ORDER — ONETOUCH DELICA LANCETS 33G MISC: Status: AC

## 2015-03-18 ENCOUNTER — Ambulatory Visit (INDEPENDENT_AMBULATORY_CARE_PROVIDER_SITE_OTHER): Payer: Medicare Other | Admitting: Family Medicine

## 2015-03-18 ENCOUNTER — Encounter: Payer: Self-pay | Admitting: Family Medicine

## 2015-03-18 VITALS — BP 160/90 | HR 69 | Temp 97.6°F | Ht <= 58 in | Wt 143.8 lb

## 2015-03-18 DIAGNOSIS — B029 Zoster without complications: Secondary | ICD-10-CM

## 2015-03-18 MED ORDER — VALACYCLOVIR HCL 1 G PO TABS: 1000.0000 mg | ORAL_TABLET | Freq: Three times a day (TID) | ORAL | Status: DC

## 2015-03-18 MED ORDER — TRAMADOL HCL 50 MG PO TABS: 50.0000 mg | ORAL_TABLET | Freq: Three times a day (TID) | ORAL | Status: DC | PRN

## 2015-03-18 NOTE — Patient Instructions (Signed)
You have shingles- this is a viral infection that causes a painful rash Take the valtrex as directed to fight the virus Use the tramadol as needed for pain- remember this can make you sleepy! It is also ok to use tylenol or ibuprofen  Let me know if you are not feeling better in the next few days- Sooner if worse.   Shingles Shingles, which is also known as herpes zoster, is an infection that causes a painful skin rash and fluid-filled blisters. Shingles is not related to genital herpes, which is a sexually transmitted infection.   Shingles only develops in people who:  Have had chickenpox.  Have received the chickenpox vaccine. (This is rare.) CAUSES Shingles is caused by varicella-zoster virus (VZV). This is the same virus that causes chickenpox. After exposure to VZV, the virus stays in the body in an inactive (dormant) state. Shingles develops if the virus reactivates. This can happen many years after the initial exposure to VZV. It is not known what causes this virus to reactivate. RISK FACTORS People who have had chickenpox or received the chickenpox vaccine are at risk for shingles. Infection is more common in people who:  Are older than age 37.  Have a weakened defense (immune) system, such as those with HIV, AIDS, or cancer.  Are taking medicines that weaken the immune system, such as transplant medicines.  Are under great stress. SYMPTOMS Early symptoms of this condition include itching, tingling, and pain in an area on your skin. Pain may be described as burning, stabbing, or throbbing. A few days or weeks after symptoms start, a painful red rash appears, usually on one side of the body in a bandlike or beltlike pattern. The rash eventually turns into fluid-filled blisters that break open, scab over, and dry up in about 2-3 weeks. At any time during the infection, you may also develop:  A fever.  Chills.  A headache.  An upset stomach. DIAGNOSIS This condition is  diagnosed with a skin exam. Sometimes, skin or fluid samples are taken from the blisters before a diagnosis is made. These samples are examined under a microscope or sent to a lab for testing. TREATMENT There is no specific cure for this condition. Your health care provider will probably prescribe medicines to help you manage pain, recover more quickly, and avoid long-term problems. Medicines may include:  Antiviral drugs.  Anti-inflammatory drugs.  Pain medicines. If the area involved is on your face, you may be referred to a specialist, such as an eye doctor (ophthalmologist) or an ear, nose, and throat (ENT) doctor to help you avoid eye problems, chronic pain, or disability. HOME CARE INSTRUCTIONS Medicines  Take medicines only as directed by your health care provider.  Apply an anti-itch or numbing cream to the affected area as directed by your health care provider. Blister and Rash Care  Take a cool bath or apply cool compresses to the area of the rash or blisters as directed by your health care provider. This may help with pain and itching.  Keep your rash covered with a loose bandage (dressing). Wear loose-fitting clothing to help ease the pain of material rubbing against the rash.  Keep your rash and blisters clean with mild soap and cool water or as directed by your health care provider.  Check your rash every day for signs of infection. These include redness, swelling, and pain that lasts or increases.  Do not pick your blisters.  Do not scratch your rash. General Instructions  Rest as directed by your health care provider.  Keep all follow-up visits as directed by your health care provider. This is important.  Until your blisters scab over, your infection can cause chickenpox in people who have never had it or been vaccinated against it. To prevent this from happening, avoid contact with other people, especially:  Babies.  Pregnant women.  Children who have  eczema.  Elderly people who have transplants.  People who have chronic illnesses, such as leukemia or AIDS. SEEK MEDICAL CARE IF:  Your pain is not relieved with prescribed medicines.  Your pain does not get better after the rash heals.  Your rash looks infected. Signs of infection include redness, swelling, and pain that lasts or increases. SEEK IMMEDIATE MEDICAL CARE IF:  The rash is on your face or nose.  You have facial pain, pain around your eye area, or loss of feeling on one side of your face.  You have ear pain or you have ringing in your ear.  You have loss of taste.  Your condition gets worse.   This information is not intended to replace advice given to you by your health care provider. Make sure you discuss any questions you have with your health care provider.   Document Released: 12/26/2004 Document Revised: 01/16/2014 Document Reviewed: 11/06/2013 Elsevier Interactive Patient Education [employer]

## 2015-03-18 NOTE — Progress Notes (Signed)
Pre visit review using our clinic review tool, if applicable. No additional management support is needed unless otherwise documented below in the visit note. 

## 2015-03-18 NOTE — Progress Notes (Signed)
[facility] [address] [place], [place] [address] [phone]  Date:  03/18/2015   Name:  Julie [name]   DOB:  1929-10-10   MRN:  [id number]  PCP:  Loreen Freud, DO    Chief Complaint: Rash   History of Present Illness:  Julie [name] is a 82 y.o. very pleasant female patient who presents with the following:  Here today with likely shingles on her left sid.e  She noted a burning feeling about 3-4 days ago and the rash started yesterday.  She did not get zostavax due to lack of insurance coverage.    She has tried some ibuprofen for the pain- it has not helped that much.    She had diet controled DM  Lab Results  Component Value Date   HGBA1C 6.3 05/08/2014     Patient Active Problem List   Diagnosis Date Noted  . Diabetes type 2, controlled (HCC) 04/04/2011  . POSTMENOPAUSAL STATUS 10/28/2008  . HYPOKALEMIA 09/30/2007  . EDEMA 08/22/2007  . HYPERLIPIDEMIA 08/01/2006  . GOUT 08/01/2006  . HYPERTENSION 08/01/2006  . OSTEOPENIA 08/01/2006    Past Medical History  Diagnosis Date  . Hyperlipidemia   . Hypertension   . Osteopenia   . Gout   . Diabetes mellitus     Past Surgical History  Procedure Laterality Date  . Cataract extraction    . Cataract extraction Left 7/15    Social History  Substance Use Topics  . Smoking status: Never Smoker   . Smokeless tobacco: None  . Alcohol Use: Yes    Family History  Problem Relation Age of Onset  . Stroke Other     No Known Allergies  Medication list has been reviewed and updated.  Current Outpatient Prescriptions on File Prior to Visit  Medication Sig Dispense Refill  . allopurinol (ZYLOPRIM) 100 MG tablet TAKE TWO TABLETS BY MOUTH ONCE DAILY 180 tablet 1  . aspirin 81 MG tablet Take 81 mg by mouth daily.      . colchicine 0.6 MG tablet Take 0.6 mg by mouth as needed.     . fish oil-omega-3 fatty acids 1000 MG capsule Take 2 g by mouth  daily.      Marland Kitchen glucose blood (ONE TOUCH ULTRA TEST) test strip USE ONE STRIP TO CHECK GLUCOSE TWICE DAILY. Dx: E11.9 100 each 11  . hydrochlorothiazide (HYDRODIURIL) 25 MG tablet Take 1 tablet (25 mg total) by mouth daily. 90 tablet 3  . lovastatin (MEVACOR) 20 MG tablet Take 1 tablet (20 mg total) by mouth at bedtime. Repeat labs are due now 90 tablet 0  . metoprolol (LOPRESSOR) 100 MG tablet TAKE ONE TABLET BY MOUTH TWICE DAILY 180 tablet 1  . ONETOUCH DELICA LANCETS 33G MISC USE ONE  TO CHECK GLUCOSE TWICE DAILY. Dx:E11.9 100 each 11  . Tdap (BOOSTRIX) 5-2.5-18.5 LF-MCG/0.5 injection Inject 0.5 mLs into the muscle once. 0.5 mL 0  . zoster vaccine live, PF, (ZOSTAVAX) 29528 UNT/0.65ML injection Inject 19,400 Units into the skin once. 1 each 0   No current facility-administered medications on file prior to visit.    Review of Systems:  As per HPI- otherwise negative. Noted BP is up a bit likely due to pain- she denies any CP or HA   Physical Examination: Filed Vitals:   03/18/15 1452  BP: 160/90  Pulse: 69  Temp: 97.6 F (36.4 C)   Filed Vitals:  03/18/15 1452  Height: 4\' 10"  (1.473 m)  Weight: 143 lb 12.8 oz (65.227 kg)   Body mass index is 30.06 kg/(m^2). Ideal Body Weight: Weight in (lb) to have BMI = 25: 119.4  GEN: WDWN, NAD, Non-toxic, A & O x 3 HEENT: Atraumatic, Normocephalic. Neck supple. No masses, No LAD. Ears and Nose: No external deformity. CV: RRR, No M/G/R. No JVD. No thrill. No extra heart sounds. PULM: CTA B, no wheezes, crackles, rhonchi. No retractions. No resp. distress. No accessory muscle use. ABD: S, NT, ND, +BS. No rebound. No HSM. EXTR: No c/c/e NEURO Normal gait.  PSYCH: Normally interactive. Conversant. Not depressed or anxious appearing.  Calm demeanor.  Typical shingles rash on her left side  BP Readings from Last 3 Encounters:  03/18/15 160/90  05/08/14 130/80  08/20/13 132/84     Assessment and Plan: Shingles - Plan: valACYclovir  (VALTREX) 1000 MG tablet, traMADol (ULTRAM) 50 MG tablet  Treat for shingles with valtrex and tramadol if needed for pain Discussed possible contagion- avoid pregnant women and young children. She will follow-up if any concerns or if not getting better soon  Signed Abbe Amsterdam, MD

## 2015-04-09 ENCOUNTER — Other Ambulatory Visit: Payer: Self-pay | Admitting: Family Medicine

## 2015-04-12 NOTE — Telephone Encounter (Signed)
Please schedule this patient a Fasting CPE.      KP

## 2015-04-14 NOTE — Telephone Encounter (Signed)
Appointment scheduled.

## 2015-05-12 ENCOUNTER — Other Ambulatory Visit: Payer: Self-pay | Admitting: Family Medicine

## 2015-05-26 ENCOUNTER — Other Ambulatory Visit: Payer: Self-pay | Admitting: Family Medicine

## 2015-05-27 NOTE — Telephone Encounter (Signed)
Medication filled to pharmacy as requested.   

## 2015-07-11 ENCOUNTER — Other Ambulatory Visit: Payer: Self-pay | Admitting: Family Medicine

## 2015-07-27 ENCOUNTER — Encounter: Payer: Self-pay | Admitting: Family Medicine

## 2015-07-27 ENCOUNTER — Ambulatory Visit (INDEPENDENT_AMBULATORY_CARE_PROVIDER_SITE_OTHER): Payer: Medicare Other | Admitting: Family Medicine

## 2015-07-27 VITALS — BP 152/84 | HR 63 | Temp 97.7°F | Ht <= 58 in | Wt 143.4 lb

## 2015-07-27 DIAGNOSIS — M109 Gout, unspecified: Secondary | ICD-10-CM

## 2015-07-27 DIAGNOSIS — I10 Essential (primary) hypertension: Secondary | ICD-10-CM

## 2015-07-27 DIAGNOSIS — R319 Hematuria, unspecified: Secondary | ICD-10-CM

## 2015-07-27 DIAGNOSIS — Z23 Encounter for immunization: Secondary | ICD-10-CM

## 2015-07-27 DIAGNOSIS — E1151 Type 2 diabetes mellitus with diabetic peripheral angiopathy without gangrene: Secondary | ICD-10-CM | POA: Diagnosis not present

## 2015-07-27 DIAGNOSIS — E785 Hyperlipidemia, unspecified: Secondary | ICD-10-CM | POA: Diagnosis not present

## 2015-07-27 DIAGNOSIS — E1165 Type 2 diabetes mellitus with hyperglycemia: Secondary | ICD-10-CM | POA: Diagnosis not present

## 2015-07-27 DIAGNOSIS — Z0000 Encounter for general adult medical examination without abnormal findings: Secondary | ICD-10-CM | POA: Diagnosis not present

## 2015-07-27 DIAGNOSIS — IMO0002 Reserved for concepts with insufficient information to code with codable children: Secondary | ICD-10-CM

## 2015-07-27 LAB — POCT URINALYSIS DIPSTICK
BILIRUBIN UA: NEGATIVE
GLUCOSE UA: NEGATIVE
KETONES UA: NEGATIVE
NITRITE UA: NEGATIVE
PH UA: 5.5
Protein, UA: NEGATIVE
RBC UA: NEGATIVE
Spec Grav, UA: >=1.03
Urobilinogen, UA: 0.2

## 2015-07-27 MED ORDER — LOVASTATIN 20 MG PO TABS: ORAL_TABLET | ORAL | Status: DC

## 2015-07-27 MED ORDER — HYDROCHLOROTHIAZIDE 25 MG PO TABS: 25.0000 mg | ORAL_TABLET | Freq: Every day | ORAL | Status: DC

## 2015-07-27 MED ORDER — METOPROLOL TARTRATE 100 MG PO TABS: 100.0000 mg | ORAL_TABLET | Freq: Two times a day (BID) | ORAL | Status: DC

## 2015-07-27 MED ORDER — ALLOPURINOL 100 MG PO TABS: 200.0000 mg | ORAL_TABLET | Freq: Every day | ORAL | Status: DC

## 2015-07-27 MED ORDER — PNEUMOCOCCAL 13-VAL CONJ VACC IM SUSP
0.5000 mL | Freq: Once | INTRAMUSCULAR | Status: AC
  Administered 2015-07-27: 0.5 mL via INTRAMUSCULAR

## 2015-07-27 NOTE — Patient Instructions (Signed)

## 2015-07-27 NOTE — Progress Notes (Signed)
Pre visit review using our clinic review tool, if applicable. No additional management support is needed unless otherwise documented below in the visit note. 

## 2015-07-27 NOTE — Progress Notes (Signed)
Subjective:   Julie [name] is a 82 y.o. female who presents for Medicare Annual (Subsequent) preventive examination.  Review of Systems:   Review of Systems  Constitutional: Negative for activity change, appetite change and fatigue.  HENT: Negative for hearing loss, congestion, tinnitus and ear discharge.   Eyes: Negative for visual disturbance (see optho q1y -- vision corrected to 20/20 with glasses).  Respiratory: Negative for cough, chest tightness and shortness of breath.   Cardiovascular: Negative for chest pain, palpitations and leg swelling.  Gastrointestinal: Negative for abdominal pain, diarrhea, constipation and abdominal distention.  Genitourinary: Negative for urgency, frequency, decreased urine volume and difficulty urinating.  Musculoskeletal: Negative for back pain, arthralgias and gait problem.  Skin: Negative for color change, pallor and rash.  Neurological: Negative for dizziness, light-headedness, numbness and headaches.  Hematological: Negative for adenopathy. Does not bruise/bleed easily.  Psychiatric/Behavioral: Negative for suicidal ideas, confusion, sleep disturbance, self-injury, dysphoric mood, decreased concentration and agitation.  Pt is able to read and write and can do all ADLs No risk for falling No abuse/ violence in home           Objective:     Vitals: BP 152/84 mmHg  Pulse 63  Temp(Src) 97.7 F (36.5 C) (Oral)  Ht  (1.473 m)  Wt 143 lb 6.4 oz (65.046 kg)  BMI 29.98 kg/m2  SpO2 96%  Body mass index is 29.98 kg/(m^2).  BP 152/84 mmHg  Pulse 63  Temp(Src) 97.7 F (36.5 C) (Oral)  Ht  (1.473 m)  Wt 143 lb 6.4 oz (65.046 kg)  BMI 29.98 kg/m2  SpO2 96% General appearance: alert, cooperative, appears stated age and no distress Head: Normocephalic, without obvious abnormality, atraumatic Eyes: negative findings: lids and lashes normal and pupils equal, round, reactive to light and accomodation Ears: normal TM's and  external ear canals both ears Nose: Nares normal. Septum midline. Mucosa normal. No drainage or sinus tenderness. Throat: lips, mucosa, and tongue normal; teeth and gums normal Neck: no adenopathy, no carotid bruit, no JVD, supple, symmetrical, trachea midline and thyroid not enlarged, symmetric, no tenderness/mass/nodules Back: symmetric, no curvature. ROM normal. No CVA tenderness. Lungs: clear to auscultation bilaterally Breasts: normal appearance, no masses or tenderness Heart: regular rate and rhythm, S1, S2 normal, no murmur, click, rub or gallop Abdomen: soft, non-tender; bowel sounds normal; no masses,  no organomegaly Pelvic: not indicated; post-menopausal, no abnormal Pap smears in past Extremities: extremities normal, atraumatic, no cyanosis or edema Pulses: 2+ and symmetric Skin: Skin color, texture, turgor normal. No rashes or lesions Lymph nodes: Cervical, supraclavicular, and axillary nodes normal. Neurologic: Alert and oriented X 3, normal strength and tone. Normal symmetric reflexes. Normal coordination and gait Tobacco History  Smoking status  . Never Smoker   Smokeless tobacco  . Not on file     Counseling given: Not Answered   Past Medical History  Diagnosis Date  . Hyperlipidemia   . Hypertension   . Osteopenia   . Gout   . Diabetes mellitus    Past Surgical History  Procedure Laterality Date  . Cataract extraction    . Cataract extraction Left 7/15   Family History  Problem Relation Age of Onset  . Stroke Other    History  Sexual Activity  . Sexual Activity: Not on file      Activities of Daily Living In your present state of health, do you have any difficulty performing the following activities: 07/27/2015 03/18/2015  Hearing? Malvin Johns  Vision? N N  Difficulty concentrating or making decisions? N N  Walking or climbing stairs? N N  Dressing or bathing? N N  Doing errands, shopping? N N    Patient Care Team: Donato Schultz, DO as PCP -  General    Assessment:    cpe Exercise Activities and Dietary recommendations    Goals    None     Fall Risk Fall Risk  07/27/2015 03/18/2015 05/08/2014 05/08/2014 01/24/2013  Falls in the past year? No No No No No  Risk for fall due to : - - History of fall(s) - -   Depression Screen PHQ 2/9 Scores 07/27/2015 03/18/2015 05/08/2014 05/08/2014  PHQ - 2 Score 0 0 0 0  Exception Documentation - Patient refusal - -     Cognitive Testing No flowsheet data found.  Immunization History  Administered Date(s) Administered  . Influenza Whole 11/30/2006, 09/30/2007, 10/14/2008, 10/07/2009, 10/03/2010, 10/12/2011  . Influenza-Unspecified 10/09/2012, 10/03/2013, 10/10/2014  . Pneumococcal Polysaccharide-23 10/28/2008   Screening Tests Health Maintenance  Topic Date Due  . FOOT EXAM  11/05/2012  . OPHTHALMOLOGY EXAM  08/07/2014  . MAMMOGRAM  08/28/2014  . HEMOGLOBIN A1C  11/07/2014  . URINE MICROALBUMIN  05/08/2015  . ZOSTAVAX  07/26/2016 (Originally 01/08/1992)  . TETANUS/TDAP  07/26/2016 (Originally 01/08/1949)  . PNA vac Low Risk Adult (2 of 2 - PCV13) 07/26/2016 (Originally 10/28/2009)  . INFLUENZA VACCINE  08/10/2015      Plan:   see AVS During the course of the visit the patient was educated and counseled about the following appropriate screening and preventive services:   Vaccines to include Pneumoccal, Influenza, Hepatitis B, Td, Zostavax, HCV  Electrocardiogram  Cardiovascular Disease  Colorectal cancer screening  Bone density screening  Diabetes screening  Glaucoma screening  Mammography/PAP  Nutrition counseling   Patient Instructions (the written plan) was given to the patient.  1. Essential hypertension stable - EKG 12-Lead - hydrochlorothiazide (HYDRODIURIL) 25 MG tablet; Take 1 tablet (25 mg total) by mouth daily.  Dispense: 90 tablet; Refill: 0 - metoprolol (LOPRESSOR) 100 MG tablet; Take 1 tablet (100 mg total) by mouth 2 (two) times daily.  Dispense:  180 tablet; Refill: 0 - POCT urinalysis dipstick - Lipid panel - CBC with Differential/Platelet - Comprehensive metabolic panel - Hemoglobin A1c  2. Gout of multiple sites, unspecified cause, unspecified chronicity stable - allopurinol (ZYLOPRIM) 100 MG tablet; Take 2 tablets (200 mg total) by mouth daily.  Dispense: 180 tablet; Refill: 1  3. Essential hypertension  - EKG 12-Lead - hydrochlorothiazide (HYDRODIURIL) 25 MG tablet; Take 1 tablet (25 mg total) by mouth daily.  Dispense: 90 tablet; Refill: 0 - metoprolol (LOPRESSOR) 100 MG tablet; Take 1 tablet (100 mg total) by mouth 2 (two) times daily.  Dispense: 180 tablet; Refill: 0 - POCT urinalysis dipstick - Lipid panel - CBC with Differential/Platelet - Comprehensive metabolic panel - Hemoglobin A1c  4. Hyperlipidemia Check labs - lovastatin (MEVACOR) 20 MG tablet; TAKE ONE TABLET BY MOUTH AT BEDTIME  Dispense: 90 tablet; Refill: 0 - Lipid panel - Comprehensive metabolic panel  5. DM (diabetes mellitus) type II uncontrolled, periph vascular disorder (HCC)   - POCT urinalysis dipstick - Comprehensive metabolic panel - Hemoglobin A1c  6. Routine history and physical examination of adult    7. Need for prophylactic vaccination against Streptococcus pneumoniae (pneumococcus)   - pneumococcal 13-valent conjugate vaccine (PREVNAR 13) injection 0.5 mL; Inject 0.5 mLs into the muscle once.  8. Hematuria   -  Urine culture   Donato Schultz, DO  07/27/2015

## 2015-07-28 ENCOUNTER — Encounter: Payer: Self-pay | Admitting: Family Medicine

## 2015-07-28 LAB — LIPID PANEL
CHOL/HDL RATIO: 2
Cholesterol: 158 mg/dL (ref 0–200)
HDL: 65.9 mg/dL (ref >39.00)
LDL Cholesterol: 70 mg/dL (ref 0–99)
NONHDL: 91.88
Triglycerides: 109 mg/dL (ref 0.0–149.0)
VLDL: 21.8 mg/dL (ref 0.0–40.0)

## 2015-07-28 LAB — CBC WITH DIFFERENTIAL/PLATELET
BASOS PCT: 0.3 % (ref 0.0–3.0)
Basophils Absolute: 0 10*3/uL (ref 0.0–0.1)
EOS PCT: 0.5 % (ref 0.0–5.0)
Eosinophils Absolute: 0 10*3/uL (ref 0.0–0.7)
HCT: 41.7 % (ref 36.0–46.0)
Hemoglobin: 13.7 g/dL (ref 12.0–15.0)
LYMPHS ABS: 1.9 10*3/uL (ref 0.7–4.0)
Lymphocytes Relative: 33.8 % (ref 12.0–46.0)
MCHC: 32.9 g/dL (ref 30.0–36.0)
MCV: 98.9 fl (ref 78.0–100.0)
MONO ABS: 0.5 10*3/uL (ref 0.1–1.0)
MONOS PCT: 9.4 % (ref 3.0–12.0)
NEUTROS ABS: 3.2 10*3/uL (ref 1.4–7.7)
NEUTROS PCT: 56 % (ref 43.0–77.0)
PLATELETS: 157 10*3/uL (ref 150.0–400.0)
RBC: 4.22 Mil/uL (ref 3.87–5.11)
RDW: 17.2 % — AB (ref 11.5–15.5)
WBC: 5.7 10*3/uL (ref 4.0–10.5)

## 2015-07-28 LAB — HEMOGLOBIN A1C: Hgb A1c MFr Bld: 6.4 % (ref 4.6–6.5)

## 2015-07-28 LAB — COMPREHENSIVE METABOLIC PANEL
ALK PHOS: 62 U/L (ref 39–117)
ALT: 12 U/L (ref 0–35)
AST: 18 U/L (ref 0–37)
Albumin: 4.3 g/dL (ref 3.5–5.2)
BUN: 24 mg/dL — ABNORMAL HIGH (ref 6–23)
CHLORIDE: 98 meq/L (ref 96–112)
CO2: 31 meq/L (ref 19–32)
Calcium: 10 mg/dL (ref 8.4–10.5)
Creatinine, Ser: 0.9 mg/dL (ref 0.40–1.20)
GFR: 63.47 mL/min (ref >60.00)
GLUCOSE: 111 mg/dL — AB (ref 70–99)
POTASSIUM: 4.2 meq/L (ref 3.5–5.1)
SODIUM: 140 meq/L (ref 135–145)
TOTAL PROTEIN: 7.2 g/dL (ref 6.0–8.3)
Total Bilirubin: 1 mg/dL (ref 0.2–1.2)

## 2015-07-29 LAB — URINE CULTURE: Organism ID, Bacteria: 10000

## 2015-07-30 ENCOUNTER — Telehealth: Payer: Self-pay | Admitting: Family Medicine

## 2015-07-30 NOTE — Telephone Encounter (Signed)
Pt is returning your call for lab results °

## 2015-07-30 NOTE — Telephone Encounter (Signed)
Patient was made aware of lab results. She voiced understanding and did not have any questions or concerns prior to call ending.

## 2015-09-30 DIAGNOSIS — Z23 Encounter for immunization: Secondary | ICD-10-CM | POA: Diagnosis not present

## 2015-10-15 ENCOUNTER — Other Ambulatory Visit: Payer: Self-pay | Admitting: Family Medicine

## 2015-10-15 DIAGNOSIS — I10 Essential (primary) hypertension: Secondary | ICD-10-CM

## 2015-11-17 ENCOUNTER — Encounter: Payer: Self-pay | Admitting: Family Medicine

## 2015-11-17 DIAGNOSIS — R05 Cough: Secondary | ICD-10-CM | POA: Diagnosis not present

## 2015-11-17 DIAGNOSIS — J329 Chronic sinusitis, unspecified: Secondary | ICD-10-CM | POA: Diagnosis not present

## 2015-11-17 DIAGNOSIS — E860 Dehydration: Secondary | ICD-10-CM | POA: Diagnosis not present

## 2015-11-18 DIAGNOSIS — E860 Dehydration: Secondary | ICD-10-CM | POA: Diagnosis not present

## 2015-11-28 ENCOUNTER — Other Ambulatory Visit: Payer: Self-pay | Admitting: Family Medicine

## 2015-11-28 DIAGNOSIS — I10 Essential (primary) hypertension: Secondary | ICD-10-CM

## 2015-11-29 NOTE — Telephone Encounter (Signed)
Refill sent per [facility] refill protocol/SLS  

## 2016-01-17 ENCOUNTER — Other Ambulatory Visit: Payer: Self-pay | Admitting: Family Medicine

## 2016-01-17 DIAGNOSIS — E785 Hyperlipidemia, unspecified: Secondary | ICD-10-CM

## 2016-01-31 ENCOUNTER — Telehealth: Payer: Self-pay | Admitting: Family Medicine

## 2016-01-31 NOTE — Telephone Encounter (Signed)
Patient called stating that she has the flu. She is feeling weak, vomiting, and has no appetite. She is requesting that Dr. Zola Button call her in a prescription. Please advise.   Phone: [phone]

## 2016-01-31 NOTE — Telephone Encounter (Signed)
She needs to be triaged----  Does she have fever > 100.4 ,  Vomiting normally stomach bug , not flu Need more info

## 2016-01-31 NOTE — Telephone Encounter (Signed)
Patient has been sick for 10 days, no appetite, cough, drinking water and gatorade, weakness. No Fever,does have some nausea and vomiting, NO diarrhea.

## 2016-01-31 NOTE — Telephone Encounter (Signed)
Patients daughter called with concern.  States the patient  has been sick for 2 weeks.  She refuses to go to the Dr.    She was seen in an ER 2 months ago while visiting someone and she was dehydrated.  The daughter states her kidneys were off at that time and wonders if they is why she is nausea/no appetite/she is at home alone and gets depressed and the daughter states she drinks a lot of Vodka and just sleeps.The daughter went to see her at lunch today and states she was wrapped up in 2 blankets and coughing.  The daughter requested an appt as soon as possible. PCP had no openings tomorrow 02/01/2016 so scheduled with Dr. Drue Novel at 1:15 pm on 02/01/2016.  Will forward this message to MD seeing this patient tomorrow.

## 2016-01-31 NOTE — Telephone Encounter (Signed)
If she is only drinking vodka , she is dehydrated and kidneys are also probably bad  She can take her to er for psych eval --- she may need med admit for clearance before psych will admit but if she is that depressed daughter can have her admitted

## 2016-01-31 NOTE — Telephone Encounter (Signed)
Called left message to call back 

## 2016-02-01 ENCOUNTER — Ambulatory Visit (HOSPITAL_BASED_OUTPATIENT_CLINIC_OR_DEPARTMENT_OTHER)
Admission: RE | Admit: 2016-02-01 | Discharge: 2016-02-01 | Disposition: A | Discharge status: Home / Self Care | Payer: Medicare Other | Source: Ambulatory Visit | Attending: Internal Medicine | Admitting: Internal Medicine

## 2016-02-01 ENCOUNTER — Ambulatory Visit (INDEPENDENT_AMBULATORY_CARE_PROVIDER_SITE_OTHER): Payer: Medicare Other | Admitting: Internal Medicine

## 2016-02-01 ENCOUNTER — Encounter: Payer: Self-pay | Admitting: Internal Medicine

## 2016-02-01 ENCOUNTER — Telehealth: Payer: Self-pay

## 2016-02-01 VITALS — BP 128/80 | HR 58 | Temp 97.6°F | Resp 12 | Ht <= 58 in | Wt 129.5 lb

## 2016-02-01 DIAGNOSIS — R110 Nausea: Secondary | ICD-10-CM

## 2016-02-01 DIAGNOSIS — R05 Cough: Secondary | ICD-10-CM

## 2016-02-01 DIAGNOSIS — R5383 Other fatigue: Secondary | ICD-10-CM

## 2016-02-01 DIAGNOSIS — I10 Essential (primary) hypertension: Secondary | ICD-10-CM

## 2016-02-01 DIAGNOSIS — R059 Cough, unspecified: Secondary | ICD-10-CM

## 2016-02-01 DIAGNOSIS — I700 Atherosclerosis of aorta: Secondary | ICD-10-CM | POA: Diagnosis not present

## 2016-02-01 LAB — CBC WITH DIFFERENTIAL/PLATELET
BASOS ABS: 0 10*3/uL (ref 0.0–0.1)
BASOS PCT: 0.3 % (ref 0.0–3.0)
Eosinophils Absolute: 0 10*3/uL (ref 0.0–0.7)
Eosinophils Relative: 0.2 % (ref 0.0–5.0)
HEMATOCRIT: 43.3 % (ref 36.0–46.0)
Hemoglobin: 14.5 g/dL (ref 12.0–15.0)
LYMPHS PCT: 22.9 % (ref 12.0–46.0)
Lymphs Abs: 1.4 10*3/uL (ref 0.7–4.0)
MCHC: 33.6 g/dL (ref 30.0–36.0)
MCV: 95.6 fl (ref 78.0–100.0)
MONOS PCT: 13 % — AB (ref 3.0–12.0)
Monocytes Absolute: 0.8 10*3/uL (ref 0.1–1.0)
NEUTROS ABS: 3.8 10*3/uL (ref 1.4–7.7)
Neutrophils Relative %: 63.6 % (ref 43.0–77.0)
PLATELETS: 150 10*3/uL (ref 150.0–400.0)
RBC: 4.53 Mil/uL (ref 3.87–5.11)
RDW: 17 % — AB (ref 11.5–15.5)
WBC: 6 10*3/uL (ref 4.0–10.5)

## 2016-02-01 LAB — COMPREHENSIVE METABOLIC PANEL
ALBUMIN: 4.3 g/dL (ref 3.5–5.2)
ALT: 23 U/L (ref 0–35)
AST: 41 U/L — ABNORMAL HIGH (ref 0–37)
Alkaline Phosphatase: 83 U/L (ref 39–117)
BUN: 28 mg/dL — ABNORMAL HIGH (ref 6–23)
CALCIUM: 9.7 mg/dL (ref 8.4–10.5)
CHLORIDE: 80 meq/L — AB (ref 96–112)
CO2: 31 meq/L (ref 19–32)
Creatinine, Ser: 1.07 mg/dL (ref 0.40–1.20)
GFR: 51.92 mL/min — AB (ref >60.00)
Glucose, Bld: 113 mg/dL — ABNORMAL HIGH (ref 70–99)
POTASSIUM: 3.9 meq/L (ref 3.5–5.1)
Sodium: 128 mEq/L — ABNORMAL LOW (ref 135–145)
Total Bilirubin: 0.9 mg/dL (ref 0.2–1.2)
Total Protein: 7.4 g/dL (ref 6.0–8.3)

## 2016-02-01 LAB — FOLATE: FOLATE: 12 ng/mL (ref >5.9)

## 2016-02-01 LAB — VITAMIN B12: Vitamin B-12: 497 pg/mL (ref 211–911)

## 2016-02-01 MED ORDER — AZITHROMYCIN 250 MG PO TABS: ORAL_TABLET | ORAL | 0 refills | Status: DC

## 2016-02-01 MED ORDER — PANTOPRAZOLE SODIUM 40 MG PO TBEC: 40.0000 mg | DELAYED_RELEASE_TABLET | Freq: Every day | ORAL | 0 refills | Status: DC

## 2016-02-01 NOTE — Progress Notes (Signed)
Pre visit review using our clinic review tool, if applicable. No additional management support is needed unless otherwise documented below in the visit note. 

## 2016-02-01 NOTE — Progress Notes (Signed)
Subjective:    Patient ID: Julie [name], female    DOB: [birth year], 83 y.o.   MRN: [id number]  DOS:  02/01/2016 Type of visit - description : acute, here with her daughter Interval history:  The daughter asked her to speak to me in private before I see the patient, reports that her mother has not been doing well for the last 2 months, she spent time in [place] for a while 2 months ago, over there she started to c/o lack of energy, decreased appetite, she she was drinking alcohol daily. Daughter felt she was depressed. She refused medical care except for a 1 visit to the emergency room, she was told she was dehydrated a creatinine was increased; pt refused an admission.  I interviewed the patient. She reports that she got sick with "the flu" 3 weeks ago, reports chills, generalized aches and some cough. Since then, no further fevers but she is extremely fatigued and that is actually her main concern. She also has lost his appetite completely, has occasional nausea. Reports that is taking daily ibuprofen twice a day for a while.  I asked about alcohol intake and she admits to drinking daily, I asked her if she has more than 2 drinks and she said no, just take one drink at night  Review of Systems  She specifically denied chest pain, difficulty breathing. No vomiting, diarrhea, abdominal pain or blood in the stools. Reports that she does not have postprandial pain. I asked him about depression and anxiety and she strongly denies. No suicidal ideas. Denies insomnia. The daughter reports she has no problems with memory.   Past Medical History:  Diagnosis Date  . Diabetes mellitus   . Gout   . Hyperlipidemia   . Hypertension   . Osteopenia     Past Surgical History:  Procedure Laterality Date  . CATARACT EXTRACTION    . CATARACT EXTRACTION Left 7/15    Social History   Social History  . Marital status: Widowed    Spouse name: N/A  . Number of children: 2  . Years of  education: N/A   Occupational History  . n/a    Social History Main Topics  . Smoking status: Never Smoker  . Smokeless tobacco: Never Used  . Alcohol use Yes     Comment: occ per pt   . Drug use: No  . Sexual activity: No   Other Topics Concern  . Not on file   Social History Narrative   Lives by herself      Allergies as of 02/01/2016   No Known Allergies     Medication List       Accurate as of 02/01/16  6:19 PM. Always use your most recent med list.          allopurinol 100 MG tablet Commonly known as:  ZYLOPRIM Take 2 tablets (200 mg total) by mouth daily.   aspirin 81 MG tablet Take 81 mg by mouth daily.   azithromycin 250 MG tablet Commonly known as:  ZITHROMAX Take 2 tablets today, and 1 tablet everyday thereafter   colchicine 0.6 MG tablet Take 0.6 mg by mouth as needed.   fish oil-omega-3 fatty acids 1000 MG capsule Take 2 g by mouth daily.   glucose blood test strip Commonly known as:  ONE TOUCH ULTRA TEST USE ONE STRIP TO CHECK GLUCOSE TWICE DAILY. Dx: E11.9   hydrochlorothiazide 25 MG tablet Commonly known as:  HYDRODIURIL TAKE ONE TABLET BY  MOUTH ONCE DAILY   lovastatin 20 MG tablet Commonly known as:  MEVACOR TAKE ONE TABLET BY MOUTH AT BEDTIME   metoprolol 100 MG tablet Commonly known as:  LOPRESSOR TAKE ONE TABLET BY MOUTH TWICE DAILY   ONETOUCH DELICA LANCETS 33G Misc USE ONE  TO CHECK GLUCOSE TWICE DAILY. Dx:E11.9   pantoprazole 40 MG tablet Commonly known as:  PROTONIX Take 1 tablet (40 mg total) by mouth daily.          Objective:   Physical Exam BP 128/80 (BP Location: Left Arm, Patient Position: Sitting, Cuff Size: Small)   Pulse (!) 58   Temp 97.6 F (36.4 C) (Oral)   Resp 12   Ht 4\' 10"  (1.473 m)   Wt 129 lb 8 oz (58.7 kg)   SpO2 96%   BMI 27.07 kg/m  General:   Well developed, well nourished .  HEENT:  Normocephalic . Face symmetric, atraumatic Neck: No thyromegaly Lungs:  Slightly decreased breath  sounds, a few rhonchi. No crackles Normal respiratory effort, no intercostal retractions, no accessory muscle use. Heart: RRR,  no murmur.  no pretibial edema bilaterally  Abdomen:  Not distended, soft, non-tender. No rebound or rigidity.  Skin: Not pale. Not jaundice Neurologic:  alert & oriented X3.  Speech normal, gait appropriate for age and unassisted Psych--  Cognition and judgment appear intact.  Cooperative with normal attention span and concentration.  Behavior appropriate. Diagnosis but not depressed appearing.    Assessment & Plan:    83 year old lady, here with her daughter, she lives by herself in [place]. She has a history of diabetes, gout, HTN, high cholesterol and osteopenia times with the following issues: -Viral syndrome 3 weeks ago now with residual cough and rhonchi on exam. --Fatigue, severe per pt -Nausea, decreased appetite -Daily NSAIDs -EtOH daily, abuse is likely -Depression? Patient denies Plan: Cough- Chest x-ray, treat as bronchitis. Fatigue: multifactorial? Checking a CBC, folic acid, B12, vit D  HTN with daily NSAIDs use: Stop NSAIDs, checking the CMP, CBC. Depression? Daughter thinks the patient is depressed, she denies it. Suspect  EtOH abuse: Checking vitamin  Levels, discussed abstinence Nausea: Abdominal exam normal, checking labs, stop NSAIDs, started. PPIs. Get records from the ER visiting [place] 2 months ago Follow-up with PCP 2 weeks  Today, I spent more than 40  min with the patient and her daughter, I interviewed the daughter separately per her request, reviewed records .  Addendum #1: After we finish the extended visit and she was checking out, the patient told the daughter that she is indeed depressed. I simply could not bring her back to the room to continue assessing her. Recommend to come back ASAP to be seen by me or PCP Addendum #2: Records from the ER 11/17/2015: Was seen with chief complaint of cough, congestion and  decreased appetite. Vital signs were stable. Hemoglobin 14.3, white count 10.3. Creatinine 1.6, potassium 4.79, chest x-ray negative. DX sinusitis and dehydration, declined admission. Was requested to return the next day. ER visit 11/18/2015: BP 140/63, creatinine decreased to 1.1. Was released home.

## 2016-02-01 NOTE — Patient Instructions (Signed)
GO TO THE LAB : Get the blood work     GO TO THE FRONT DESK Schedule your next appointment for a  follow-up with Dr. Laury Axon in 2 weeks    STOP BY THE FIRST FLOOR:  get the XR    Start taking Zithromax Mucinex DM for cough Stop ibuprofen or any other similar medications Go to the ER if you feel worse I would for now stop drinking alcohol altogether Start taking a medication called pantoprazole 40 mg one tablet daily. Before breakfast. This is an acid reducer

## 2016-02-01 NOTE — Telephone Encounter (Signed)
Received medical records. Records forwarded to Dr. Drue Novel for review.

## 2016-02-01 NOTE — Telephone Encounter (Signed)
ROI completed and faxed to [facility] in [place] at [phone]. ROI sent for scanning, awaiting records.

## 2016-02-01 NOTE — Telephone Encounter (Signed)
Pt seen by Dr. Drue Novel today at 1315.

## 2016-02-01 NOTE — Telephone Encounter (Signed)
Received fax confirmation 02/01/2016 at 1351.

## 2016-02-02 ENCOUNTER — Other Ambulatory Visit (INDEPENDENT_AMBULATORY_CARE_PROVIDER_SITE_OTHER): Payer: Medicare Other

## 2016-02-02 DIAGNOSIS — R5383 Other fatigue: Secondary | ICD-10-CM

## 2016-02-02 LAB — TSH: TSH: 4.97 u[IU]/mL — ABNORMAL HIGH (ref 0.35–4.50)

## 2016-02-02 NOTE — Telephone Encounter (Signed)
Spoke w/ Pt -informed of x-ray and lab results, understands to hold HCTZ-will forward message to PCP for FYI regarding additional labs, informed to let us know if cough not improving -TSH add on faxed to main lab -offered earlier appt w/ Dr. Drue Novel regarding depression, Pt prefers to wait to speak w/ PCP but will let us know if she changes her mind

## 2016-02-02 NOTE — Telephone Encounter (Signed)
Records reviewed, see office visit. Please call with the patient or the patient's daughter regarding options results: -Chest x-ray okay -Sodium is low--recommend to hold hydrochlorothiazide, will need sodium recheck in one week along with a Urine Na level -Urine osmolarity-Serum osmolarity.  ( when she sees PCP) -Please add a TSH to her labs, redraw if needed -I see that she has an appointment to 02-11-16 with PCP, definitely keep it, if depression needs to be addressed sooner please schedule her with me.

## 2016-02-03 NOTE — Telephone Encounter (Signed)
Will need to discuss at [facility]

## 2016-02-03 NOTE — Telephone Encounter (Signed)
Records sent for scanning

## 2016-02-04 LAB — VITAMIN D 1,25 DIHYDROXY
Vitamin D 1, 25 (OH)2 Total: 55 pg/mL (ref 18–72)
Vitamin D3 1, 25 (OH)2: 55 pg/mL

## 2016-02-11 ENCOUNTER — Encounter: Payer: Self-pay | Admitting: Family Medicine

## 2016-02-11 ENCOUNTER — Ambulatory Visit (INDEPENDENT_AMBULATORY_CARE_PROVIDER_SITE_OTHER): Payer: Medicare Other | Admitting: Family Medicine

## 2016-02-11 VITALS — BP 142/62 | HR 55 | Temp 97.4°F | Resp 16 | Ht <= 58 in | Wt 134.2 lb

## 2016-02-11 DIAGNOSIS — F32A Depression, unspecified: Secondary | ICD-10-CM | POA: Insufficient documentation

## 2016-02-11 DIAGNOSIS — E785 Hyperlipidemia, unspecified: Secondary | ICD-10-CM

## 2016-02-11 DIAGNOSIS — E119 Type 2 diabetes mellitus without complications: Secondary | ICD-10-CM | POA: Diagnosis not present

## 2016-02-11 DIAGNOSIS — I10 Essential (primary) hypertension: Secondary | ICD-10-CM | POA: Diagnosis not present

## 2016-02-11 DIAGNOSIS — E118 Type 2 diabetes mellitus with unspecified complications: Secondary | ICD-10-CM

## 2016-02-11 DIAGNOSIS — F329 Major depressive disorder, single episode, unspecified: Secondary | ICD-10-CM | POA: Diagnosis not present

## 2016-02-11 LAB — COMPREHENSIVE METABOLIC PANEL
ALBUMIN: 3.8 g/dL (ref 3.5–5.2)
ALT: 16 U/L (ref 0–35)
AST: 21 U/L (ref 0–37)
Alkaline Phosphatase: 59 U/L (ref 39–117)
BILIRUBIN TOTAL: 0.7 mg/dL (ref 0.2–1.2)
BUN: 14 mg/dL (ref 6–23)
CALCIUM: 9.3 mg/dL (ref 8.4–10.5)
CHLORIDE: 104 meq/L (ref 96–112)
CO2: 34 mEq/L — ABNORMAL HIGH (ref 19–32)
Creatinine, Ser: 0.7 mg/dL (ref 0.40–1.20)
GFR: 84.71 mL/min (ref >60.00)
Glucose, Bld: 120 mg/dL — ABNORMAL HIGH (ref 70–99)
Potassium: 3.1 mEq/L — ABNORMAL LOW (ref 3.5–5.1)
SODIUM: 143 meq/L (ref 135–145)
Total Protein: 6.8 g/dL (ref 6.0–8.3)

## 2016-02-11 LAB — CBC WITH DIFFERENTIAL/PLATELET
BASOS PCT: 0.8 % (ref 0.0–3.0)
Basophils Absolute: 0 10*3/uL (ref 0.0–0.1)
Eosinophils Absolute: 0 10*3/uL (ref 0.0–0.7)
Eosinophils Relative: 0.5 % (ref 0.0–5.0)
HEMATOCRIT: 37.1 % (ref 36.0–46.0)
HEMOGLOBIN: 12.2 g/dL (ref 12.0–15.0)
LYMPHS PCT: 34.8 % (ref 12.0–46.0)
Lymphs Abs: 1.5 10*3/uL (ref 0.7–4.0)
MCHC: 32.8 g/dL (ref 30.0–36.0)
MCV: 99.5 fl (ref 78.0–100.0)
Monocytes Absolute: 0.9 10*3/uL (ref 0.1–1.0)
Monocytes Relative: 19.7 % — ABNORMAL HIGH (ref 3.0–12.0)
NEUTROS ABS: 1.9 10*3/uL (ref 1.4–7.7)
NEUTROS PCT: 44.2 % (ref 43.0–77.0)
PLATELETS: 146 10*3/uL — AB (ref 150.0–400.0)
RBC: 3.73 Mil/uL — ABNORMAL LOW (ref 3.87–5.11)
RDW: 17 % — ABNORMAL HIGH (ref 11.5–15.5)
WBC: 4.3 10*3/uL (ref 4.0–10.5)

## 2016-02-11 LAB — POCT URINALYSIS DIPSTICK
BILIRUBIN UA: NEGATIVE
Blood, UA: NEGATIVE
GLUCOSE UA: NEGATIVE
Ketones, UA: NEGATIVE
LEUKOCYTES UA: NEGATIVE
NITRITE UA: NEGATIVE
Protein, UA: NEGATIVE
Spec Grav, UA: 1.025
UROBILINOGEN UA: NEGATIVE
pH, UA: 6

## 2016-02-11 LAB — HEMOGLOBIN A1C: HEMOGLOBIN A1C: 5.9 % (ref 4.6–6.5)

## 2016-02-11 LAB — LIPID PANEL
CHOLESTEROL: 145 mg/dL (ref 0–200)
HDL: 55.6 mg/dL (ref >39.00)
LDL Cholesterol: 70 mg/dL (ref 0–99)
NonHDL: 89.63
TRIGLYCERIDES: 99 mg/dL (ref 0.0–149.0)
Total CHOL/HDL Ratio: 3
VLDL: 19.8 mg/dL (ref 0.0–40.0)

## 2016-02-11 LAB — TSH: TSH: 5.17 u[IU]/mL (ref <=5.90)

## 2016-02-11 MED ORDER — PANTOPRAZOLE SODIUM 40 MG PO TBEC: 40.0000 mg | DELAYED_RELEASE_TABLET | Freq: Every day | ORAL | 1 refills | Status: DC

## 2016-02-11 MED ORDER — SERTRALINE HCL 50 MG PO TABS: 50.0000 mg | ORAL_TABLET | Freq: Every day | ORAL | 3 refills | Status: DC

## 2016-02-11 NOTE — Assessment & Plan Note (Signed)
Not suicidal Start meds and rto 1 month Or sooner prn

## 2016-02-11 NOTE — Progress Notes (Signed)
Subjective:    Patient ID: Julie [name], female    DOB: 11-20-1929, 83 y.o.   MRN: [id number]  Chief Complaint  Patient presents with  . Hypertension    follow up  . Diabetes    follow up    HPI Patient is in today to discuss depression..  She is not sleeping .       Past Medical History:  Diagnosis Date  . Diabetes mellitus   . Gout   . Hyperlipidemia   . Hypertension   . Osteopenia     Past Surgical History:  Procedure Laterality Date  . CATARACT EXTRACTION    . CATARACT EXTRACTION Left 7/15    Family History  Problem Relation Age of Onset  . Stroke Other     Social History   Social History  . Marital status: Widowed    Spouse name: N/A  . Number of children: 2  . Years of education: N/A   Occupational History  . n/a    Social History Main Topics  . Smoking status: Never Smoker  . Smokeless tobacco: Never Used  . Alcohol use Yes     Comment: occ per pt   . Drug use: No  . Sexual activity: No   Other Topics Concern  . Not on file   Social History Narrative   Lives by herself    Outpatient Medications Prior to Visit  Medication Sig Dispense Refill  . allopurinol (ZYLOPRIM) 100 MG tablet Take 2 tablets (200 mg total) by mouth daily. 180 tablet 1  . aspirin 81 MG tablet Take 81 mg by mouth daily.      . colchicine 0.6 MG tablet Take 0.6 mg by mouth as needed.     . fish oil-omega-3 fatty acids 1000 MG capsule Take 2 g by mouth daily.      Marland Kitchen glucose blood (ONE TOUCH ULTRA TEST) test strip USE ONE STRIP TO CHECK GLUCOSE TWICE DAILY. Dx: E11.9 100 each 11  . lovastatin (MEVACOR) 20 MG tablet TAKE ONE TABLET BY MOUTH AT BEDTIME 90 tablet 0  . metoprolol (LOPRESSOR) 100 MG tablet TAKE ONE TABLET BY MOUTH TWICE DAILY 180 tablet 0  . ONETOUCH DELICA LANCETS 33G MISC USE ONE  TO CHECK GLUCOSE TWICE DAILY. Dx:E11.9 100 each 11  . pantoprazole (PROTONIX) 40 MG tablet Take 1 tablet (40 mg total) by mouth daily. 30 tablet 0  . azithromycin (ZITHROMAX) 250  MG tablet Take 2 tablets today, and 1 tablet everyday thereafter (Patient not taking: Reported on 02/11/2016) 6 tablet 0  . hydrochlorothiazide (HYDRODIURIL) 25 MG tablet TAKE ONE TABLET BY MOUTH ONCE DAILY (Patient not taking: Reported on 02/11/2016) 90 tablet 1   No facility-administered medications prior to visit.     No Known Allergies  Review of Systems  Constitutional: Negative for fever and malaise/fatigue.  HENT: Negative for congestion.   Eyes: Negative for blurred vision.  Respiratory: Negative for cough and shortness of breath.   Cardiovascular: Negative for chest pain, palpitations and leg swelling.  Gastrointestinal: Negative for vomiting.  Musculoskeletal: Negative for back pain.  Skin: Negative for rash.  Neurological: Negative for loss of consciousness and headaches.       Objective:    Physical Exam  Constitutional: She is oriented to person, place, and time. She appears well-developed and well-nourished.  HENT:  Head: Normocephalic and atraumatic.  Eyes: Conjunctivae and EOM are normal.  Neck: Normal range of motion. Neck supple. No JVD present. Carotid bruit is  not present. No thyromegaly present.  Cardiovascular: Normal rate, regular rhythm and normal heart sounds.   No murmur heard. Pulmonary/Chest: Effort normal and breath sounds normal. No respiratory distress. She has no wheezes. She has no rales. She exhibits no tenderness.  Musculoskeletal: She exhibits no edema.  Neurological: She is alert and oriented to person, place, and time.  Psychiatric: Her mood appears anxious. Her affect is not angry, not blunt, not labile and not inappropriate. Thought content is not paranoid and not delusional. She exhibits a depressed mood. She expresses no homicidal and no suicidal ideation. She expresses no suicidal plans and no homicidal plans.  Nursing note and vitals reviewed.   BP (!) 142/62 (BP Location: Left Arm, Patient Position: Sitting, Cuff Size: Normal)   Pulse  (!) 55   Temp 97.4 F (36.3 C) (Oral)   Resp 16   Ht 4\' 8"  (1.422 m)   Wt 134 lb 3.2 oz (60.9 kg)   SpO2 97%   BMI 30.09 kg/m  Wt Readings from Last 3 Encounters:  02/11/16 134 lb 3.2 oz (60.9 kg)  02/01/16 129 lb 8 oz (58.7 kg)  07/27/15 143 lb 6.4 oz (65 kg)     Lab Results  Component Value Date   WBC 4.3 02/11/2016   HGB 12.2 02/11/2016   HCT 37.1 02/11/2016   PLT 146.0 (L) 02/11/2016   GLUCOSE 120 (H) 02/11/2016   CHOL 145 02/11/2016   TRIG 99.0 02/11/2016   HDL 55.60 02/11/2016   LDLDIRECT 97.7 01/19/2010   LDLCALC 70 02/11/2016   ALT 16 02/11/2016   AST 21 02/11/2016   NA 143 02/11/2016   K 3.1 (L) 02/11/2016   CL 104 02/11/2016   CREATININE 0.70 02/11/2016   BUN 14 02/11/2016   CO2 34 (H) 02/11/2016   TSH 4.97 (H) 02/02/2016   HGBA1C 5.9 02/11/2016   MICROALBUR 3.8 (H) 05/08/2014    Lab Results  Component Value Date   TSH 4.97 (H) 02/02/2016   Lab Results  Component Value Date   WBC 4.3 02/11/2016   HGB 12.2 02/11/2016   HCT 37.1 02/11/2016   MCV 99.5 02/11/2016   PLT 146.0 (L) 02/11/2016   Lab Results  Component Value Date   NA 143 02/11/2016   K 3.1 (L) 02/11/2016   CO2 34 (H) 02/11/2016   GLUCOSE 120 (H) 02/11/2016   BUN 14 02/11/2016   CREATININE 0.70 02/11/2016   BILITOT 0.7 02/11/2016   ALKPHOS 59 02/11/2016   AST 21 02/11/2016   ALT 16 02/11/2016   PROT 6.8 02/11/2016   ALBUMIN 3.8 02/11/2016   CALCIUM 9.3 02/11/2016   GFR 84.71 02/11/2016   Lab Results  Component Value Date   CHOL 145 02/11/2016   Lab Results  Component Value Date   HDL 55.60 02/11/2016   Lab Results  Component Value Date   LDLCALC 70 02/11/2016   Lab Results  Component Value Date   TRIG 99.0 02/11/2016   Lab Results  Component Value Date   CHOLHDL 3 02/11/2016   Lab Results  Component Value Date   HGBA1C 5.9 02/11/2016       Assessment & Plan:   Problem List Items Addressed This Visit      Unprioritized   Depression    Not  suicidal Start meds and rto 1 month Or sooner prn       Relevant Medications   sertraline (ZOLOFT) 50 MG tablet   Other Relevant Orders   TSH+T4F+T3Free   Diabetes type  2, controlled (HCC)    Check labs con't meds Needs new meter      Hyperlipidemia LDL goal <70    con't meds Check labs      Relevant Orders   CBC with Differential/Platelet (Completed)   Hemoglobin A1c (Completed)   Lipid panel (Completed)   POCT urinalysis dipstick (Completed)   Comprehensive metabolic panel (Completed)    Other Visit Diagnoses    Type 2 diabetes mellitus with complication, without long-term current use of insulin (HCC)    -  Primary   Relevant Orders   Hemoglobin A1c (Completed)   Comprehensive metabolic panel (Completed)   Essential hypertension       Relevant Orders   CBC with Differential/Platelet (Completed)   Hemoglobin A1c (Completed)   Lipid panel (Completed)   POCT urinalysis dipstick (Completed)   Comprehensive metabolic panel (Completed)      I have discontinued Ms. Lienhard's hydrochlorothiazide. I am also having her start on sertraline. Additionally, I am having her maintain her aspirin, colchicine, fish oil-omega-3 fatty acids, glucose blood, ONETOUCH DELICA LANCETS 33G, allopurinol, metoprolol, lovastatin, azithromycin, and pantoprazole.  Meds ordered this encounter  Medications  . sertraline (ZOLOFT) 50 MG tablet    Sig: Take 1 tablet (50 mg total) by mouth daily.    Dispense:  30 tablet    Refill:  3  . pantoprazole (PROTONIX) 40 MG tablet    Sig: Take 1 tablet (40 mg total) by mouth daily.    Dispense:  90 tablet    Refill:  1    CMA served as Neurosurgeon during this visit. History, Physical and Plan performed by medical provider. Documentation and orders reviewed and attested to.  Donato Schultz, DO

## 2016-02-11 NOTE — Assessment & Plan Note (Signed)
Check labs con't meds Needs new meter

## 2016-02-11 NOTE — Assessment & Plan Note (Signed)
con't meds  Check labs 

## 2016-02-11 NOTE — Patient Instructions (Signed)

## 2016-02-11 NOTE — Progress Notes (Signed)
Pre visit review using our clinic review tool, if applicable. No additional management support is needed unless otherwise documented below in the visit note. 

## 2016-02-12 LAB — TSH+T4F+T3FREE
FREE T4: 1.23 ng/dL (ref 0.82–1.77)
T3, Free: 3.3 pg/mL (ref 2.0–4.4)
TSH: 5.17 u[IU]/mL — ABNORMAL HIGH (ref 0.450–4.500)

## 2016-02-15 ENCOUNTER — Other Ambulatory Visit: Payer: Self-pay | Admitting: Family Medicine

## 2016-02-15 DIAGNOSIS — E039 Hypothyroidism, unspecified: Secondary | ICD-10-CM

## 2016-02-18 ENCOUNTER — Telehealth: Payer: Self-pay | Admitting: Family Medicine

## 2016-02-18 NOTE — Telephone Encounter (Signed)
Relation to TK:TCCE Call back number:[phone] Pharmacy:  [facility] pharmacy [address], [place], [place] [address] [phone]  Patient requesting test strips for her one touch ultra glucose meter, patient would like to speak with nurse prior to Rx sent

## 2016-02-22 MED ORDER — GLUCOSE BLOOD VI STRP: ORAL_STRIP | 11 refills | Status: AC

## 2016-02-22 MED ORDER — GLUCOSE BLOOD VI STRP: ORAL_STRIP | 11 refills | Status: DC

## 2016-02-22 NOTE — Telephone Encounter (Signed)
Patient called to follow up on request for Test Strips. States she is out of strips.

## 2016-02-22 NOTE — Telephone Encounter (Signed)
Refill done to [facility] the patient to inform -- left a detailed message.

## 2016-02-27 DIAGNOSIS — K219 Gastro-esophageal reflux disease without esophagitis: Secondary | ICD-10-CM | POA: Diagnosis not present

## 2016-02-27 DIAGNOSIS — I169 Hypertensive crisis, unspecified: Secondary | ICD-10-CM | POA: Diagnosis not present

## 2016-02-27 DIAGNOSIS — I10 Essential (primary) hypertension: Secondary | ICD-10-CM | POA: Diagnosis not present

## 2016-02-27 DIAGNOSIS — R0602 Shortness of breath: Secondary | ICD-10-CM | POA: Diagnosis not present

## 2016-02-27 DIAGNOSIS — E039 Hypothyroidism, unspecified: Secondary | ICD-10-CM | POA: Diagnosis not present

## 2016-02-27 DIAGNOSIS — E871 Hypo-osmolality and hyponatremia: Secondary | ICD-10-CM | POA: Diagnosis not present

## 2016-02-27 DIAGNOSIS — M109 Gout, unspecified: Secondary | ICD-10-CM | POA: Diagnosis not present

## 2016-02-27 DIAGNOSIS — I509 Heart failure, unspecified: Secondary | ICD-10-CM | POA: Diagnosis not present

## 2016-02-27 DIAGNOSIS — E785 Hyperlipidemia, unspecified: Secondary | ICD-10-CM | POA: Diagnosis present

## 2016-02-27 DIAGNOSIS — E876 Hypokalemia: Secondary | ICD-10-CM | POA: Diagnosis not present

## 2016-02-27 DIAGNOSIS — R634 Abnormal weight loss: Secondary | ICD-10-CM | POA: Diagnosis present

## 2016-02-27 DIAGNOSIS — F329 Major depressive disorder, single episode, unspecified: Secondary | ICD-10-CM | POA: Diagnosis present

## 2016-02-27 DIAGNOSIS — F338 Other recurrent depressive disorders: Secondary | ICD-10-CM | POA: Diagnosis not present

## 2016-02-27 DIAGNOSIS — E119 Type 2 diabetes mellitus without complications: Secondary | ICD-10-CM | POA: Diagnosis not present

## 2016-02-27 DIAGNOSIS — M7989 Other specified soft tissue disorders: Secondary | ICD-10-CM | POA: Diagnosis not present

## 2016-02-28 LAB — PROTIME-INR

## 2016-03-03 DIAGNOSIS — N179 Acute kidney failure, unspecified: Secondary | ICD-10-CM | POA: Diagnosis not present

## 2016-03-03 LAB — BASIC METABOLIC PANEL
BUN: 42 mg/dL — AB (ref 4–21)
CREATININE: 1.6 mg/dL — AB (ref 0.5–1.1)
Glucose: 135 mg/dL
POTASSIUM: 4.9 mmol/L (ref 3.4–5.3)
SODIUM: 138 mmol/L (ref 137–147)

## 2016-03-07 ENCOUNTER — Other Ambulatory Visit: Payer: Self-pay | Admitting: Family Medicine

## 2016-03-07 DIAGNOSIS — M109 Gout, unspecified: Secondary | ICD-10-CM

## 2016-03-08 ENCOUNTER — Encounter: Payer: Self-pay | Admitting: *Deleted

## 2016-03-08 ENCOUNTER — Telehealth: Payer: Self-pay | Admitting: *Deleted

## 2016-03-08 MED ORDER — LEVOTHYROXINE SODIUM 25 MCG PO TABS: 25.0000 ug | ORAL_TABLET | Freq: Every day | ORAL | 2 refills | Status: DC

## 2016-03-08 NOTE — Telephone Encounter (Signed)
Still hypothyroid start synthroid 1 po qd #30 with 2 refills.  Recheck in 2 months.  Patient notified and rx sent to [facility].

## 2016-03-13 ENCOUNTER — Ambulatory Visit (INDEPENDENT_AMBULATORY_CARE_PROVIDER_SITE_OTHER): Payer: Medicare Other | Admitting: Family Medicine

## 2016-03-13 ENCOUNTER — Encounter: Payer: Self-pay | Admitting: Family Medicine

## 2016-03-13 VITALS — BP 126/66 | HR 46 | Temp 97.3°F | Resp 16 | Ht <= 58 in | Wt 126.0 lb

## 2016-03-13 DIAGNOSIS — R609 Edema, unspecified: Secondary | ICD-10-CM

## 2016-03-13 DIAGNOSIS — I10 Essential (primary) hypertension: Secondary | ICD-10-CM | POA: Diagnosis not present

## 2016-03-13 LAB — COMPREHENSIVE METABOLIC PANEL
ALK PHOS: 75 U/L (ref 39–117)
ALT: 8 U/L (ref 0–35)
AST: 15 U/L (ref 0–37)
Albumin: 4.2 g/dL (ref 3.5–5.2)
BILIRUBIN TOTAL: 0.4 mg/dL (ref 0.2–1.2)
BUN: 31 mg/dL — AB (ref 6–23)
CO2: 26 meq/L (ref 19–32)
CREATININE: 0.94 mg/dL (ref 0.40–1.20)
Calcium: 10.1 mg/dL (ref 8.4–10.5)
Chloride: 103 mEq/L (ref 96–112)
GFR: 60.27 mL/min (ref >60.00)
GLUCOSE: 115 mg/dL — AB (ref 70–99)
Potassium: 4.6 mEq/L (ref 3.5–5.1)
SODIUM: 139 meq/L (ref 135–145)
TOTAL PROTEIN: 7.5 g/dL (ref 6.0–8.3)

## 2016-03-13 LAB — POC URINALSYSI DIPSTICK (AUTOMATED)
Bilirubin, UA: NEGATIVE
GLUCOSE UA: NEGATIVE
Ketones, UA: NEGATIVE
Leukocytes, UA: NEGATIVE
NITRITE UA: NEGATIVE
PH UA: 6
Protein, UA: NEGATIVE
RBC UA: NEGATIVE
UROBILINOGEN UA: 0.2

## 2016-03-13 MED ORDER — LISINOPRIL 10 MG PO TABS: 10.0000 mg | ORAL_TABLET | Freq: Every day | ORAL | 2 refills | Status: DC

## 2016-03-13 NOTE — Progress Notes (Signed)
Patient ID: Julie [name], female   DOB: 06-16-1929, 83 y.o.   MRN: [id number]     Subjective:    Patient ID: Julie [name], female    DOB: [birth year], 84 y.o.   MRN: [id number]  Chief Complaint  Patient presents with  . Hospitalization Follow-up    02/27/16-02/28/16 [facility] for high blood pressure and swelling in legs and ankles.    HPI  Patient is in today for hospital follow up from [facility] in [place], [place].  She was there from 02/27/16 to 02/28/16 for high blood pressure and swelling in legs and ankles.  Also has some concerns about BUN and creatinine on follow up blood work.  Both were high.  Patient Care Team: Donato Schultz, DO as PCP - General   Past Medical History:  Diagnosis Date  . Diabetes mellitus   . Gout   . Hyperlipidemia   . Hypertension   . Osteopenia     Past Surgical History:  Procedure Laterality Date  . CATARACT EXTRACTION    . CATARACT EXTRACTION Left 7/15    Family History  Problem Relation Age of Onset  . Stroke Other     Social History   Social History  . Marital status: Widowed    Spouse name: N/A  . Number of children: 2  . Years of education: N/A   Occupational History  . n/a    Social History Main Topics  . Smoking status: Never Smoker  . Smokeless tobacco: Never Used  . Alcohol use Yes     Comment: occ per pt   . Drug use: No  . Sexual activity: No   Other Topics Concern  . Not on file   Social History Narrative   Lives by herself    Outpatient Medications Prior to Visit  Medication Sig Dispense Refill  . allopurinol (ZYLOPRIM) 100 MG tablet TAKE TWO TABLETS BY MOUTH ONCE DAILY 180 tablet 0  . aspirin 81 MG tablet Take 81 mg by mouth daily.      Marland Kitchen azithromycin (ZITHROMAX) 250 MG tablet Take 2 tablets today, and 1 tablet everyday thereafter 6 tablet 0  . colchicine 0.6 MG tablet Take 0.6 mg by mouth as needed.     . fish oil-omega-3 fatty acids 1000 MG capsule Take 2 g by mouth  daily.      Marland Kitchen glucose blood (ONE TOUCH ULTRA TEST) test strip USE ONE STRIP TO CHECK GLUCOSE TWICE DAILY. Dx: E11.9 100 each 11  . levothyroxine (SYNTHROID) 25 MCG tablet Take 1 tablet (25 mcg total) by mouth daily before breakfast. 30 tablet 2  . lovastatin (MEVACOR) 20 MG tablet TAKE ONE TABLET BY MOUTH AT BEDTIME 90 tablet 0  . metoprolol (LOPRESSOR) 100 MG tablet TAKE ONE TABLET BY MOUTH TWICE DAILY 180 tablet 0  . ONETOUCH DELICA LANCETS 33G MISC USE ONE  TO CHECK GLUCOSE TWICE DAILY. Dx:E11.9 100 each 11  . pantoprazole (PROTONIX) 40 MG tablet Take 1 tablet (40 mg total) by mouth daily. 90 tablet 1  . sertraline (ZOLOFT) 50 MG tablet Take 1 tablet (50 mg total) by mouth daily. 30 tablet 3   No facility-administered medications prior to visit.     No Known Allergies  Review of Systems  Constitutional: Negative for chills, fever and malaise/fatigue.  HENT: Negative for congestion and hearing loss.   Eyes: Negative for discharge.  Respiratory: Negative for cough, sputum production and shortness of breath.   Cardiovascular: Negative  for chest pain, palpitations and leg swelling.  Gastrointestinal: Negative for abdominal pain, blood in stool, constipation, diarrhea, heartburn, nausea and vomiting.  Genitourinary: Negative for dysuria, frequency, hematuria and urgency.  Musculoskeletal: Negative for back pain, falls and myalgias.  Skin: Negative for rash.  Neurological: Negative for dizziness, sensory change, loss of consciousness, weakness and headaches.  Endo/Heme/Allergies: Negative for environmental allergies. Does not bruise/bleed easily.  Psychiatric/Behavioral: Negative for depression and suicidal ideas. The patient is not nervous/anxious and does not have insomnia.        Objective:    Physical Exam  Constitutional: She is oriented to person, place, and time. She appears well-developed and well-nourished.  HENT:  Head: Normocephalic and atraumatic.  Eyes: Conjunctivae and  EOM are normal.  Neck: Normal range of motion. Neck supple. No JVD present. Carotid bruit is not present. No thyromegaly present.  Cardiovascular: Normal rate, regular rhythm and normal heart sounds.   No murmur heard. Pulmonary/Chest: Effort normal and breath sounds normal. No respiratory distress. She has no wheezes. She has no rales. She exhibits no tenderness.  Musculoskeletal: She exhibits no edema.  Neurological: She is alert and oriented to person, place, and time.  Psychiatric: She has a normal mood and affect. Her behavior is normal. Judgment and thought content normal.  Nursing note and vitals reviewed.   BP 126/66 (BP Location: Left Arm, Cuff Size: Normal)   Pulse (!) 46   Temp 97.3 F (36.3 C)   Resp 16   Ht 4\' 8"  (1.422 m)   Wt 126 lb (57.2 kg)   SpO2 98%   BMI 28.25 kg/m  Wt Readings from Last 3 Encounters:  03/13/16 126 lb (57.2 kg)  02/11/16 134 lb 3.2 oz (60.9 kg)  02/01/16 129 lb 8 oz (58.7 kg)     Lab Results  Component Value Date   WBC 4.3 02/11/2016   HGB 12.2 02/11/2016   HCT 37.1 02/11/2016   PLT 146.0 (L) 02/11/2016   GLUCOSE 115 (H) 03/13/2016   CHOL 145 02/11/2016   TRIG 99.0 02/11/2016   HDL 55.60 02/11/2016   LDLDIRECT 97.7 01/19/2010   LDLCALC 70 02/11/2016   ALT 8 03/13/2016   AST 15 03/13/2016   NA 139 03/13/2016   K 4.6 03/13/2016   CL 103 03/13/2016   CREATININE 0.94 03/13/2016   BUN 31 (H) 03/13/2016   CO2 26 03/13/2016   TSH 5.170 (H) 02/11/2016   HGBA1C 5.9 02/11/2016   MICROALBUR 3.8 (H) 05/08/2014    Lab Results  Component Value Date   TSH 5.170 (H) 02/11/2016   Lab Results  Component Value Date   WBC 4.3 02/11/2016   HGB 12.2 02/11/2016   HCT 37.1 02/11/2016   MCV 99.5 02/11/2016   PLT 146.0 (L) 02/11/2016   Lab Results  Component Value Date   NA 139 03/13/2016   K 4.6 03/13/2016   CO2 26 03/13/2016   GLUCOSE 115 (H) 03/13/2016   BUN 31 (H) 03/13/2016   CREATININE 0.94 03/13/2016   BILITOT 0.4 03/13/2016     ALKPHOS 75 03/13/2016   AST 15 03/13/2016   ALT 8 03/13/2016   PROT 7.5 03/13/2016   ALBUMIN 4.2 03/13/2016   CALCIUM 10.1 03/13/2016   GFR 60.27 03/13/2016   Lab Results  Component Value Date   CHOL 145 02/11/2016   Lab Results  Component Value Date   HDL 55.60 02/11/2016   Lab Results  Component Value Date   LDLCALC 70 02/11/2016   Lab Results  Component Value Date   TRIG 99.0 02/11/2016   Lab Results  Component Value Date   CHOLHDL 3 02/11/2016   Lab Results  Component Value Date   HGBA1C 5.9 02/11/2016       Assessment & Plan:   Problem List Items Addressed This Visit      Unprioritized   EDEMA - Primary   Relevant Orders   Comprehensive metabolic panel (Completed)   POCT Urinalysis Dipstick (Automated) (Completed)   ECHOCARDIOGRAM COMPLETE   Ambulatory referral to Cardiology   ECHOCARDIOGRAM COMPLETE   Essential hypertension    con't lisinopril and metoprolol Pt is not currently taking lasix Echo ordered       Relevant Medications   lisinopril (PRINIVIL,ZESTRIL) 10 MG tablet   Other Relevant Orders   Comprehensive metabolic panel (Completed)   POCT Urinalysis Dipstick (Automated) (Completed)   ECHOCARDIOGRAM COMPLETE   Ambulatory referral to Cardiology   ECHOCARDIOGRAM COMPLETE      I have changed Ms. Aspinall's lisinopril. I am also having her maintain her aspirin, colchicine, fish oil-omega-3 fatty acids, ONETOUCH DELICA LANCETS 33G, metoprolol, lovastatin, azithromycin, sertraline, pantoprazole, glucose blood, allopurinol, and levothyroxine.  Meds ordered this encounter  Medications  . DISCONTD: lisinopril (PRINIVIL,ZESTRIL) 10 MG tablet  . lisinopril (PRINIVIL,ZESTRIL) 10 MG tablet    Sig: Take 1 tablet (10 mg total) by mouth daily.    Dispense:  30 tablet    Refill:  2    CMA served as scribe during this visit. History, Physical and Plan performed by medical provider. Documentation and orders reviewed and attested to.  Donato Schultz, DO

## 2016-03-13 NOTE — Patient Instructions (Addendum)
Make sure you increase water intake (try flavor packets like Crystal Light packets).   Edema Edema is an abnormal buildup of fluids in your bodytissues. Edema is somewhatdependent on gravity to pull the fluid to the lowest place in your body. That makes the condition more common in the legs and thighs (lower extremities). Painless swelling of the feet and ankles is common and becomes more likely as you get older. It is also common in looser tissues, like around your eyes. When the affected area is squeezed, the fluid may move out of that spot and leave a dent for a few moments. This dent is called pitting. What are the causes? There are many possible causes of edema. Eating too much salt and being on your feet or sitting for a long time can cause edema in your legs and ankles. Hot weather may make edema worse. Common medical causes of edema include:  Heart failure.  Liver disease.  Kidney disease.  Weak blood vessels in your legs.  Cancer.  An injury.  Pregnancy.  Some medications.  Obesity. What are the signs or symptoms? Edema is usually painless.Your skin may look swollen or shiny. How is this diagnosed? Your health care provider may be able to diagnose edema by asking about your medical history and doing a physical exam. You may need to have tests such as X-rays, an electrocardiogram, or blood tests to check for medical conditions that may cause edema. How is this treated? Edema treatment depends on the cause. If you have heart, liver, or kidney disease, you need the treatment appropriate for these conditions. General treatment may include:  Elevation of the affected body part above the level of your heart.  Compression of the affected body part. Pressure from elastic bandages or support stockings squeezes the tissues and forces fluid back into the blood vessels. This keeps fluid from entering the tissues.  Restriction of fluid and salt intake.  Use of a water pill  (diuretic). These medications are appropriate only for some types of edema. They pull fluid out of your body and make you urinate more often. This gets rid of fluid and reduces swelling, but diuretics can have side effects. Only use diuretics as directed by your health care provider. Follow these instructions at home:  Keep the affected body part above the level of your heart when you are lying down.  Do not sit still or stand for prolonged periods.  Do not put anything directly under your knees when lying down.  Do not wear constricting clothing or garters on your upper legs.  Exercise your legs to work the fluid back into your blood vessels. This may help the swelling go down.  Wear elastic bandages or support stockings to reduce ankle swelling as directed by your health care provider.  Eat a low-salt diet to reduce fluid if your health care provider recommends it.  Only take medicines as directed by your health care provider. Contact a health care provider if:  Your edema is not responding to treatment.  You have heart, liver, or kidney disease and notice symptoms of edema.  You have edema in your legs that does not improve after elevating them.  You have sudden and unexplained weight gain. Get help right away if:  You develop shortness of breath or chest pain.  You cannot breathe when you lie down.  You develop pain, redness, or warmth in the swollen areas.  You have heart, liver, or kidney disease and suddenly get edema.  You have a fever and your symptoms suddenly get worse. This information is not intended to replace advice given to you by your health care provider. Make sure you discuss any questions you have with your health care provider. Document Released: 12/26/2004 Document Revised: 06/03/2015 Document Reviewed: 10/18/2012 Elsevier Interactive Patient Education  2017 [employer].

## 2016-03-13 NOTE — Assessment & Plan Note (Signed)
con't lisinopril and metoprolol Pt is not currently taking lasix Echo ordered

## 2016-03-13 NOTE — Progress Notes (Signed)
Pre visit review using our clinic review tool, if applicable. No additional management support is needed unless otherwise documented below in the visit note. 

## 2016-03-14 ENCOUNTER — Other Ambulatory Visit: Payer: Self-pay | Admitting: Family Medicine

## 2016-03-14 DIAGNOSIS — I10 Essential (primary) hypertension: Secondary | ICD-10-CM

## 2016-03-16 ENCOUNTER — Ambulatory Visit: Payer: Medicare Other | Admitting: Cardiovascular Disease

## 2016-03-17 ENCOUNTER — Ambulatory Visit (HOSPITAL_COMMUNITY): Payer: Medicare Other | Attending: Internal Medicine

## 2016-03-17 ENCOUNTER — Other Ambulatory Visit: Payer: Self-pay

## 2016-03-17 DIAGNOSIS — I361 Nonrheumatic tricuspid (valve) insufficiency: Secondary | ICD-10-CM | POA: Diagnosis not present

## 2016-03-17 DIAGNOSIS — I501 Left ventricular failure: Secondary | ICD-10-CM | POA: Insufficient documentation

## 2016-03-17 DIAGNOSIS — I10 Essential (primary) hypertension: Secondary | ICD-10-CM

## 2016-03-17 DIAGNOSIS — R609 Edema, unspecified: Secondary | ICD-10-CM

## 2016-03-20 ENCOUNTER — Other Ambulatory Visit: Payer: Self-pay | Admitting: Family Medicine

## 2016-03-20 DIAGNOSIS — I5189 Other ill-defined heart diseases: Secondary | ICD-10-CM

## 2016-03-20 DIAGNOSIS — R011 Cardiac murmur, unspecified: Secondary | ICD-10-CM

## 2016-04-14 ENCOUNTER — Other Ambulatory Visit: Payer: Self-pay | Admitting: Family Medicine

## 2016-04-14 DIAGNOSIS — E785 Hyperlipidemia, unspecified: Secondary | ICD-10-CM

## 2016-05-02 NOTE — Progress Notes (Deleted)
Cardiology Office Note   Date:  05/02/2016   ID:  Julie [name], Julie [name] Jul 27, 1929, MRN [id number]  PCP:  Donato Schultz, DO  Cardiologist:   Charlton Haws, MD   No chief complaint on file.     History of Present Illness: Julie [name] is a 83 y.o. female who presents for evaluation/consultation of edema and HTN.  Referred by Dr Laury Axon CRF;s HTN, elevated lipids and DM. Widowed with some depression Had issues per daughter with ETOH excess in January. Due to edema and HTN echo ordered and reviewed. CXR done 02/01/16 with NAD   EF 65-70%  Grade one diastolic Moderate TR Estimated PA 47 mmHg    Past Medical History:  Diagnosis Date  . Diabetes mellitus   . Gout   . Hyperlipidemia   . Hypertension   . Osteopenia     Past Surgical History:  Procedure Laterality Date  . CATARACT EXTRACTION    . CATARACT EXTRACTION Left 7/15     Current Outpatient Prescriptions  Medication Sig Dispense Refill  . allopurinol (ZYLOPRIM) 100 MG tablet TAKE TWO TABLETS BY MOUTH ONCE DAILY 180 tablet 0  . aspirin 81 MG tablet Take 81 mg by mouth daily.      Marland Kitchen azithromycin (ZITHROMAX) 250 MG tablet Take 2 tablets today, and 1 tablet everyday thereafter 6 tablet 0  . colchicine 0.6 MG tablet Take 0.6 mg by mouth as needed.     . fish oil-omega-3 fatty acids 1000 MG capsule Take 2 g by mouth daily.      Marland Kitchen glucose blood (ONE TOUCH ULTRA TEST) test strip USE ONE STRIP TO CHECK GLUCOSE TWICE DAILY. Dx: E11.9 100 each 11  . levothyroxine (SYNTHROID) 25 MCG tablet Take 1 tablet (25 mcg total) by mouth daily before breakfast. 30 tablet 2  . lisinopril (PRINIVIL,ZESTRIL) 10 MG tablet Take 1 tablet (10 mg total) by mouth daily. 30 tablet 2  . lovastatin (MEVACOR) 20 MG tablet TAKE ONE TABLET BY MOUTH AT BEDTIME 90 tablet 2  . metoprolol (LOPRESSOR) 100 MG tablet TAKE ONE TABLET BY MOUTH TWICE DAILY 180 tablet 0  . ONETOUCH DELICA LANCETS 33G MISC USE ONE  TO CHECK GLUCOSE TWICE DAILY. Dx:E11.9 100  each 11  . pantoprazole (PROTONIX) 40 MG tablet Take 1 tablet (40 mg total) by mouth daily. 90 tablet 1  . sertraline (ZOLOFT) 50 MG tablet Take 1 tablet (50 mg total) by mouth daily. 30 tablet 3   No current facility-administered medications for this visit.     Allergies:   Patient has no known allergies.    Social History:  The patient  reports that she has never smoked. She has never used smokeless tobacco. She reports that she drinks alcohol. She reports that she does not use drugs.   Family History:  The patient's family history includes Stroke in her other.    ROS:  Please see the history of present illness.   Otherwise, review of systems are positive for none.   All other systems are reviewed and negative.    PHYSICAL EXAM: VS:  There were no vitals taken for this visit. , BMI There is no height or weight on file to calculate BMI. Affect appropriate Healthy:  appears stated age HEENT: normal Neck supple with no adenopathy JVP normal no bruits no thyromegaly Lungs clear with no wheezing and good diaphragmatic motion Heart:  S1/S2 no murmur, no rub, gallop or click PMI normal Abdomen: benighn, BS positve, no  tenderness, no AAA no bruit.  No HSM or HJR Distal pulses intact with no bruits No edema Neuro non-focal Skin warm and dry No muscular weakness    EKG:  07/27/15  SR ate 68 ? Old IMI    Recent Labs: 02/11/2016: Hemoglobin 12.2; Platelets 146.0; TSH 5.170 03/13/2016: ALT 8; BUN 31; Creatinine, Ser 0.94; Potassium 4.6; Sodium 139    Lipid Panel    Component Value Date/Time   CHOL 145 02/11/2016 1221   TRIG 99.0 02/11/2016 1221   HDL 55.60 02/11/2016 1221   CHOLHDL 3 02/11/2016 1221   VLDL 19.8 02/11/2016 1221   LDLCALC 70 02/11/2016 1221   LDLDIRECT 97.7 01/19/2010 1104      Wt Readings from Last 3 Encounters:  03/13/16 126 lb (57.2 kg)  02/11/16 134 lb 3.2 oz (60.9 kg)  02/01/16 129 lb 8 oz (58.7 kg)      Other studies Reviewed: Additional  studies/ records that were reviewed today include: Notes Dr Laury Axon ECG labs and echo .    ASSESSMENT AND PLAN:  1: HTN 2. DM 3. TR 4. Diastolic dysfunction 5 Cholesterol    Current medicines are reviewed at length with the patient today.  The patient does not have concerns regarding medicines.  The following changes have been made:  no change  Labs/ tests ordered today include: *** No orders of the defined types were placed in this encounter.    Disposition:   FU with cardiology PRN      Signed, Charlton Haws, MD  05/02/2016 1:42 PM    [facility] [address] [place], [place], [place]  [address] Phone: [phone]; Fax: [phone]

## 2016-05-05 ENCOUNTER — Ambulatory Visit: Payer: Medicare Other | Admitting: Cardiovascular Disease

## 2016-05-11 ENCOUNTER — Ambulatory Visit (INDEPENDENT_AMBULATORY_CARE_PROVIDER_SITE_OTHER): Payer: Medicare Other | Admitting: Cardiovascular Disease

## 2016-05-11 ENCOUNTER — Encounter: Payer: Self-pay | Admitting: Cardiovascular Disease

## 2016-05-11 VITALS — BP 148/76 | HR 54 | Ht <= 58 in | Wt 128.0 lb

## 2016-05-11 DIAGNOSIS — E039 Hypothyroidism, unspecified: Secondary | ICD-10-CM

## 2016-05-11 DIAGNOSIS — E8881 Metabolic syndrome: Secondary | ICD-10-CM

## 2016-05-11 DIAGNOSIS — Z79899 Other long term (current) drug therapy: Secondary | ICD-10-CM

## 2016-05-11 DIAGNOSIS — I10 Essential (primary) hypertension: Secondary | ICD-10-CM

## 2016-05-11 DIAGNOSIS — E785 Hyperlipidemia, unspecified: Secondary | ICD-10-CM | POA: Diagnosis not present

## 2016-05-11 MED ORDER — LISINOPRIL 10 MG PO TABS: 15.0000 mg | ORAL_TABLET | Freq: Every day | ORAL | 6 refills | Status: DC

## 2016-05-11 NOTE — Progress Notes (Addendum)
Cardiology Office Note    Date:  05/13/2016   ID:  Julie [name], DOB [birth year], MRN [id number]  PCP:  Julie [name], Julie Apa, DO  Cardiologist:  Julie Majestic, MD   New cardiology evaluation, referred by Dr. Clayborn Bigness  History of Present Illness:  Julie [name] is a 83 y.o. female who has a history of hypertension, leg swelling and had recently been hospitalized in [place], [place].  She was seen by Dr. Roma Schanz in follow-up of her hospitalization and is now referred for cardiology evaluation.  [name] admits to a 30 year history of hypertension.  02/27/2016.  She was admitted to [facility] for high blood pressure and lower extremity edema.  Of note, there were issues with renal insufficiency.  She had seen her primary physician.  March 03, 2016 BUN was 42 and currently 1.6 and she was felt to be dehydrated.  Previously her creatinine had been 0.7.  She was referred for an echo Doppler study which he had on 03/17/2016.  This showed vigorous LV function with an EF of 65-70% and normal wall motion.  There was grade 1 diastolic dysfunction.  There was evidence for moderate tricuspid regurgitation, and she had mild-to-moderate pulmonary hypertension with estimated PA pressure at 47 mm.  Most recently, she has been on a medical regimen consisting of lisinopril 10 mg, metoprolol 100 mg twice a day for hypertension.  She has been on low-dose levothyroxine 25 g for mild hypothyroidism and has a history of GERD for which he takes Protonix.  She also has a history of gout and has been on value.  All.  A subsequent renal profile in March 2018 showed improvement in her renal function with a creatinine of 0.94.  She admits to mild shortness of breath.  She denies any chest pain.  She tells me she has had blood pressure lability and at times her blood pressure has increased to 888 systolic.  Leg swelling has resolved.  She presents for new cardiology  evaluation.  She denies any awareness of sleep apnea.  An Epworth Sleepiness Scale score was endorsed the office today at 9.   Past Medical History:  Diagnosis Date  . Diabetes mellitus   . Gout   . Hyperlipidemia   . Hypertension   . Osteopenia     Past Surgical History:  Procedure Laterality Date  . CATARACT EXTRACTION    . CATARACT EXTRACTION Left 7/15   Cataract surgery done by Dr. Bing Plume.  Current Medications: Outpatient Medications Prior to Visit  Medication Sig Dispense Refill  . allopurinol (ZYLOPRIM) 100 MG tablet TAKE TWO TABLETS BY MOUTH ONCE DAILY 180 tablet 0  . aspirin 81 MG tablet Take 81 mg by mouth daily.      . fish oil-omega-3 fatty acids 1000 MG capsule Take 2 g by mouth daily.      Marland Kitchen glucose blood (ONE TOUCH ULTRA TEST) test strip USE ONE STRIP TO CHECK GLUCOSE TWICE DAILY. Dx: E11.9 100 each 11  . levothyroxine (SYNTHROID) 25 MCG tablet Take 1 tablet (25 mcg total) by mouth daily before breakfast. 30 tablet 2  . lovastatin (MEVACOR) 20 MG tablet TAKE ONE TABLET BY MOUTH AT BEDTIME 90 tablet 2  . metoprolol (LOPRESSOR) 100 MG tablet TAKE ONE TABLET BY MOUTH TWICE DAILY 180 tablet 0  . ONETOUCH DELICA LANCETS 28M MISC USE ONE  TO CHECK GLUCOSE TWICE DAILY. Dx:E11.9 100 each 11  . pantoprazole (PROTONIX) 40 MG  tablet Take 1 tablet (40 mg total) by mouth daily. 90 tablet 1  . sertraline (ZOLOFT) 50 MG tablet Take 1 tablet (50 mg total) by mouth daily. 30 tablet 3  . lisinopril (PRINIVIL,ZESTRIL) 10 MG tablet Take 1 tablet (10 mg total) by mouth daily. 30 tablet 2  . azithromycin (ZITHROMAX) 250 MG tablet Take 2 tablets today, and 1 tablet everyday thereafter 6 tablet 0  . colchicine 0.6 MG tablet Take 0.6 mg by mouth as needed.      No facility-administered medications prior to visit.      Allergies:   Patient has no known allergies.   Social History   Social History  . Marital status: Widowed    Spouse name: N/A  . Number of children: 2  . Years of  education: N/A   Occupational History  . n/a    Social History Main Topics  . Smoking status: Never Smoker  . Smokeless tobacco: Never Used  . Alcohol use Yes     Comment: occ per pt   . Drug use: No  . Sexual activity: No   Other Topics Concern  . None   Social History Narrative   Lives by herself    Additional social history is notable that she has been widowed for 35 years.  She has 2 children, 4 grandchildren, and 2 great-grandchildren.  He lives by herself.  She completed 12th grade of education.  She never smoked.  She does drink occasional wine and vodka.   Family History:  The patient's family history includes Stroke in her other.  Her mother died at 37 with a stroke.  Her father died at age 25 and had Parkinson's disease.  A brother died at age 83 with prostate cancer.  ROS General: Negative; No fevers, chills, or night sweats;  HEENT: Negative; No changes in vision or hearing, sinus congestion, difficulty swallowing Pulmonary: Negative; No cough, wheezing, shortness of breath, hemoptysis Cardiovascular: See history of present illness GI: Negative; No nausea, vomiting, diarrhea, or abdominal pain GU: Negative; No dysuria, hematuria, or difficulty voiding Musculoskeletal: Negative; no myalgias, joint pain, or weakness Hematologic/Oncology: Negative; no easy bruising, bleeding Endocrine: Positive for hypothyroidism; no diabetes Neuro: Negative; no changes in balance, headaches Skin: Negative; No rashes or skin lesions Psychiatric: Negative; No behavioral problems, depression Sleep: Negative; No snoring, daytime sleepiness, hypersomnolence, bruxism, restless legs, hypnogognic hallucinations, no cataplexy Other comprehensive 14 point system review is negative.   PHYSICAL EXAM:   VS:  BP (!) 148/76   Pulse (!) 54   Ht 4' 10"  (1.473 m)   Wt 128 lb (58.1 kg)   BMI 26.75 kg/m     Repeat blood pressure by me 170/84  Wt Readings from Last 3 Encounters:  05/11/16 128  lb (58.1 kg)  03/13/16 126 lb (57.2 kg)  02/11/16 134 lb 3.2 oz (60.9 kg)    General: Alert, oriented, no distress.  Skin: normal turgor, no rashes, warm and dry HEENT: Normocephalic, atraumatic. Pupils equal round and reactive to light; sclera anicteric; extraocular muscles intact; Fundi Status post bilateral cataracts.  No hemorrhages or exudates. Nose without nasal septal hypertrophy Mouth/Parynx benign; Mallinpatti scale 3 Neck: No JVD, no carotid bruits; normal carotid upstroke Lungs: clear to ausculatation and percussion; no wheezing or rales Chest wall: without tenderness to palpitation Heart: PMI not displaced, RRR, s1 s2 normal, 1/6 systolic murmur, no diastolic murmur, no rubs, gallops, thrills, or heaves Abdomen: soft, nontender; no hepatosplenomehaly, BS+; abdominal aorta nontender and not dilated  by palpation. Back: no CVA tenderness Pulses 2+ Musculoskeletal: full range of motion, normal strength, no joint deformities Extremities: no clubbing cyanosis or edema, Homan's sign negative  Neurologic: grossly nonfocal; Cranial nerves grossly wnl Psychologic: Normal mood and affect   Studies/Labs Reviewed:   EKG:  EKG is ordered today.  ECG (independently read by me): Sinus regarding 54 bpm.  Borderline left atrial enlargement.  QS complex anteroseptally.  Recent Labs: BMP Latest Ref Rng & Units 03/13/2016 03/03/2016 02/11/2016  Glucose 70 - 99 mg/dL 115(H) - 120(H)  BUN 6 - 23 mg/dL 31(H) 42(A) 14  Creatinine 0.40 - 1.20 mg/dL 0.94 1.6(A) 0.70  Sodium 135 - 145 mEq/L 139 138 143  Potassium 3.5 - 5.1 mEq/L 4.6 4.9 3.1(L)  Chloride 96 - 112 mEq/L 103 - 104  CO2 19 - 32 mEq/L 26 - 34(H)  Calcium 8.4 - 10.5 mg/dL 10.1 - 9.3     Hepatic Function Latest Ref Rng & Units 03/13/2016 02/11/2016 02/01/2016  Total Protein 6.0 - 8.3 g/dL 7.5 6.8 7.4  Albumin 3.5 - 5.2 g/dL 4.2 3.8 4.3  AST 0 - 37 U/L 15 21 41(H)  ALT 0 - 35 U/L 8 16 23   Alk Phosphatase 39 - 117 U/L 75 59 83  Total  Bilirubin 0.2 - 1.2 mg/dL 0.4 0.7 0.9  Bilirubin, Direct 0.0 - 0.3 mg/dL - - -    CBC Latest Ref Rng & Units 02/11/2016 02/01/2016 07/27/2015  WBC 4.0 - 10.5 K/uL 4.3 6.0 5.7  Hemoglobin 12.0 - 15.0 g/dL 12.2 14.5 13.7  Hematocrit 36.0 - 46.0 % 37.1 43.3 41.7  Platelets 150.0 - 400.0 K/uL 146.0(L) 150.0 157.0   Lab Results  Component Value Date   MCV 99.5 02/11/2016   MCV 95.6 02/01/2016   MCV 98.9 07/27/2015   Lab Results  Component Value Date   TSH 5.170 (H) 02/11/2016   Lab Results  Component Value Date   HGBA1C 5.9 02/11/2016     BNP No results found for: BNP  ProBNP No results found for: PROBNP   Lipid Panel     Component Value Date/Time   CHOL 145 02/11/2016 1221   TRIG 99.0 02/11/2016 1221   HDL 55.60 02/11/2016 1221   CHOLHDL 3 02/11/2016 1221   VLDL 19.8 02/11/2016 1221   LDLCALC 70 02/11/2016 1221   LDLDIRECT 97.7 01/19/2010 1104     RADIOLOGY: No results found.   Additional studies/ records that were reviewed today include:  I reviewed the records from the bowel primary care.  I reviewed her laboratory.  The echo Doppler study was reviewed.    ASSESSMENT:    1. Essential hypertension   2. Hyperlipidemia LDL goal <70   3. Medication management   4. Hypothyroidism, unspecified type   5. Metabolic syndrome      PLAN:  Ms. Borrero is an 83 year old female who has a 30 year history of hypertension and recent significant blood pressure lability.  She was recently hospitalized with shortness of breath and leg swelling.  I reviewed her echo Doppler study which revealed hyperdynamic LV function and grade 1 diastolic dysfunction.  There was evidence for moderate tricuspid regurgitation with moderate coronary hypertension.  She is hypertensive today and on repeat by me was 170/84.  Her renal function has improved.  I will titrate lisinopril to 15 mg.  I am scheduling her for renal duplex imaging to make certain she does not have a renovascular etiology to  her blood pressure lability.  I recommended  continue hydration and avoidance of sodium.  She's not having any chest pain or anginal type symptomatology.  I reviewed her recent lipid studies, and LDL cholesterol is 70 with a total cholesterol 145.  I will follow-up chemistry profile and will also obtain a TSH level.  She is on low-dose levothyroxine 25 g.  She's not having any gout issues.  Her recent hemoglobin A1c was consistent with prediabetes and she has been checking glucose at home.  Her GERD is controlled with Protonix.  I will see her in 3-4 months for reevaluation.   Medication Adjustments/Labs and Tests Ordered: Current medicines are reviewed at length with the patient today.  Concerns regarding medicines are outlined above.  Medication changes, Labs and Tests ordered today are listed in the Patient Instructions below. Patient Instructions  Medication Instructions:   1.) the lisinopril has been increased from 10 mg to 15 mg daily ( 1& 1/2 tablet) a new prescription has been sent to your pharmacy to reflect this change.  Labwork:  CMET, TSH next weeks. Lab slips provided to you today.  Testing/Procedures:  Your physician has requested that you have a renal artery duplex. During this test, an ultrasound is used to evaluate blood flow to the kidneys. Allow one hour for this exam. Do not eat after midnight the day before and avoid carbonated beverages. Take your medications as you usually do.    Follow-Up:  4 weeks with Dr Claiborne Billings  Any Other Special Instructions Will Be Listed Below (If Applicable).      Signed, Julie Majestic, MD  05/13/2016 1:02 PM    [facility] [address], [place], [place], [place]  [address] Phone: [phone]

## 2016-05-11 NOTE — Patient Instructions (Signed)
Medication Instructions:   1.) the lisinopril has been increased from 10 mg to 15 mg daily ( 1& 1/2 tablet) a new prescription has been sent to your pharmacy to reflect this change.  Labwork:  CMET, TSH next weeks. Lab slips provided to you today.  Testing/Procedures:  Your physician has requested that you have a renal artery duplex. During this test, an ultrasound is used to evaluate blood flow to the kidneys. Allow one hour for this exam. Do not eat after midnight the day before and avoid carbonated beverages. Take your medications as you usually do.    Follow-Up:  4 weeks with Dr Tresa Endo  Any Other Special Instructions Will Be Listed Below (If Applicable).

## 2016-05-19 DIAGNOSIS — E039 Hypothyroidism, unspecified: Secondary | ICD-10-CM | POA: Diagnosis not present

## 2016-05-19 DIAGNOSIS — I10 Essential (primary) hypertension: Secondary | ICD-10-CM | POA: Diagnosis not present

## 2016-05-19 DIAGNOSIS — Z79899 Other long term (current) drug therapy: Secondary | ICD-10-CM | POA: Diagnosis not present

## 2016-05-20 LAB — COMPREHENSIVE METABOLIC PANEL
ALT: 8 U/L (ref 6–29)
AST: 16 U/L (ref 10–35)
Albumin: 4.3 g/dL (ref 3.6–5.1)
Alkaline Phosphatase: 85 U/L (ref 33–130)
BILIRUBIN TOTAL: 0.8 mg/dL (ref 0.2–1.2)
BUN: 14 mg/dL (ref 7–25)
CHLORIDE: 98 mmol/L (ref 98–110)
CO2: 29 mmol/L (ref 20–31)
CREATININE: 0.85 mg/dL (ref 0.60–0.88)
Calcium: 9.6 mg/dL (ref 8.6–10.4)
Glucose, Bld: 121 mg/dL — ABNORMAL HIGH (ref 65–99)
Potassium: 4.9 mmol/L (ref 3.5–5.3)
SODIUM: 137 mmol/L (ref 135–146)
Total Protein: 6.7 g/dL (ref 6.1–8.1)

## 2016-05-20 LAB — TSH: TSH: 5.29 mIU/L — ABNORMAL HIGH

## 2016-05-22 ENCOUNTER — Other Ambulatory Visit: Payer: Self-pay | Admitting: Family Medicine

## 2016-05-22 DIAGNOSIS — E039 Hypothyroidism, unspecified: Secondary | ICD-10-CM

## 2016-05-22 MED ORDER — LEVOTHYROXINE SODIUM 50 MCG PO TABS: 50.0000 ug | ORAL_TABLET | Freq: Every day | ORAL | 2 refills | Status: DC

## 2016-05-23 ENCOUNTER — Telehealth: Payer: Self-pay | Admitting: *Deleted

## 2016-05-23 NOTE — Telephone Encounter (Signed)
Patient notified of results of TSH.  Rx sent in for synthroid w/ 2rf and to recheck in 2 months.

## 2016-06-03 ENCOUNTER — Other Ambulatory Visit: Payer: Self-pay | Admitting: Family Medicine

## 2016-06-03 DIAGNOSIS — F32A Depression, unspecified: Secondary | ICD-10-CM

## 2016-06-03 DIAGNOSIS — M109 Gout, unspecified: Secondary | ICD-10-CM

## 2016-06-03 DIAGNOSIS — F329 Major depressive disorder, single episode, unspecified: Secondary | ICD-10-CM

## 2016-06-08 ENCOUNTER — Ambulatory Visit (HOSPITAL_COMMUNITY)
Admission: RE | Admit: 2016-06-08 | Discharge: 2016-06-08 | Disposition: A | Discharge status: Home / Self Care | Payer: Medicare Other | Source: Ambulatory Visit | Attending: Cardiovascular Disease | Admitting: Cardiovascular Disease

## 2016-06-08 DIAGNOSIS — I10 Essential (primary) hypertension: Secondary | ICD-10-CM

## 2016-06-08 DIAGNOSIS — I701 Atherosclerosis of renal artery: Secondary | ICD-10-CM | POA: Diagnosis not present

## 2016-06-13 ENCOUNTER — Ambulatory Visit (INDEPENDENT_AMBULATORY_CARE_PROVIDER_SITE_OTHER): Payer: Medicare Other | Admitting: Cardiovascular Disease

## 2016-06-13 ENCOUNTER — Encounter: Payer: Self-pay | Admitting: Cardiovascular Disease

## 2016-06-13 VITALS — BP 164/82 | HR 58 | Ht <= 58 in | Wt 127.0 lb

## 2016-06-13 DIAGNOSIS — I10 Essential (primary) hypertension: Secondary | ICD-10-CM | POA: Diagnosis not present

## 2016-06-13 DIAGNOSIS — I700 Atherosclerosis of aorta: Secondary | ICD-10-CM | POA: Diagnosis not present

## 2016-06-13 DIAGNOSIS — E039 Hypothyroidism, unspecified: Secondary | ICD-10-CM | POA: Diagnosis not present

## 2016-06-13 DIAGNOSIS — I701 Atherosclerosis of renal artery: Secondary | ICD-10-CM

## 2016-06-13 DIAGNOSIS — Z79899 Other long term (current) drug therapy: Secondary | ICD-10-CM | POA: Diagnosis not present

## 2016-06-13 MED ORDER — LISINOPRIL 20 MG PO TABS: 20.0000 mg | ORAL_TABLET | Freq: Every day | ORAL | 11 refills | Status: DC

## 2016-06-13 NOTE — Patient Instructions (Signed)
Medication Instructions:   INCREASE lisinopril to 20mg  DAILY  Labwork:   BMET, TSH in 2 weeks (you do not need to fast)  Testing/Procedures:  none  Follow-Up:  4 months with Dr. Tresa Endo  If you need a refill on your cardiac medications before your next appointment, please call your pharmacy.

## 2016-06-13 NOTE — Progress Notes (Signed)
Cardiology Office Note    Date:  06/13/2016   ID:  [name], DOB November 20, 1929, MRN [id number]  PCP:  Carollee Herter, Alferd Apa, DO  Cardiologist:  Shelva Majestic, MD   New cardiology evaluation, referred by Dr. Clayborn Bigness  History of Present Illness:  Julie [name] is a 83 y.o. female who has a history of hypertension, leg swelling and had recently been hospitalized in [place], [place].  She was seen by Dr. Roma Schanz in follow-up of her hospitalization and was referred for cardiology evaluation.I saw her initially on 05/11/2016.  She presents for follow-up evaluation.  [name] admits to a 30 year history of hypertension.  02/27/2016.  She was admitted to [facility] for high blood pressure and lower extremity edema.  Of note, there were issues with renal insufficiency.  She had seen her primary physician.  March 03, 2016 BUN was 42 and currently 1.6 and she was felt to be dehydrated.  Previously her creatinine had been 0.7.  She was referred for an echo Doppler study which he had on 03/17/2016.  This showed vigorous LV function with an EF of 65-70% and normal wall motion.  There was grade 1 diastolic dysfunction.  There was evidence for moderate tricuspid regurgitation, and she had mild-to-moderate pulmonary hypertension with estimated PA pressure at 47 mm.  Most recently, she has been on a medical regimen consisting of lisinopril 10 mg, metoprolol 100 mg twice a day for hypertension.  She has been on low-dose levothyroxine 25 g for mild hypothyroidism and has a history of GERD for which he takes Protonix.  She also has a history of gout and has been on value.  All.  A subsequent renal profile in March 2018 showed improvement in her renal function with a creatinine of 0.94.  She admits to mild shortness of breath.  She denies any chest pain.  She has had blood pressure lability and at times her blood pressure has increased to 332 systolic.  Leg swelling has  resolved.  She presents for new cardiology evaluation.  She denies any awareness of sleep apnea.  An Epworth Sleepiness Scale score was endorsed  at 9.  When I initially saw her, her blood pressure was labile and ranged from 170/84-148/76.  I recommended she titrate lisinopril from 10-15 mg.  Follow-up chemistry profile showed a creatinine of 0.85 on 05/19/2016.  Her TSH was 5.29.  Her primary physician.  Further titrated levothyroxine from 25-50 g.  She also underwent a renal Doppler study which showed aortoiliac atherosclerosis without stenosis.  She had normal bilateral kidney size.  She had a normal right renal artery.  There was in the left renal artery in the 1-59% range with a maximum mid PSV of 252.  Since her office visit, she has felt well.  She does note some fatigue.  She is now on increased thyroid regimen.  She continues to be on lovastatin 20 mg.  She now is on lisinopril 50 mg in Lopressor 100 mg twice a day for hypertension and continues to take Protonix for GERD.  She presents for reevaluation   Past Medical History:  Diagnosis Date  . Diabetes mellitus   . Gout   . Hyperlipidemia   . Hypertension   . Osteopenia     Past Surgical History:  Procedure Laterality Date  . CATARACT EXTRACTION    . CATARACT EXTRACTION Left 7/15   Cataract surgery done by Dr. Bing Plume.  Current Medications:  Outpatient Medications Prior to Visit  Medication Sig Dispense Refill  . allopurinol (ZYLOPRIM) 100 MG tablet TAKE 2 TABLETS BY MOUTH ONCE DAILY 180 tablet 0  . aspirin 81 MG tablet Take 81 mg by mouth daily.      . fish oil-omega-3 fatty acids 1000 MG capsule Take 2 g by mouth daily.      Marland Kitchen glucose blood (ONE TOUCH ULTRA TEST) test strip USE ONE STRIP TO CHECK GLUCOSE TWICE DAILY. Dx: E11.9 100 each 11  . levothyroxine (SYNTHROID, LEVOTHROID) 50 MCG tablet Take 1 tablet (50 mcg total) by mouth daily. 30 tablet 2  . lovastatin (MEVACOR) 20 MG tablet TAKE ONE TABLET BY MOUTH AT BEDTIME 90  tablet 2  . metoprolol (LOPRESSOR) 100 MG tablet TAKE ONE TABLET BY MOUTH TWICE DAILY 180 tablet 0  . ONETOUCH DELICA LANCETS 94R MISC USE ONE  TO CHECK GLUCOSE TWICE DAILY. Dx:E11.9 100 each 11  . pantoprazole (PROTONIX) 40 MG tablet Take 1 tablet (40 mg total) by mouth daily. 90 tablet 1  . sertraline (ZOLOFT) 50 MG tablet TAKE ONE TABLET BY MOUTH ONCE DAILY 30 tablet 3  . lisinopril (PRINIVIL,ZESTRIL) 10 MG tablet Take 1.5 tablets (15 mg total) by mouth daily. 45 tablet 6   No facility-administered medications prior to visit.      Allergies:   Patient has no known allergies.   Social History   Social History  . Marital status: Widowed    Spouse name: N/A  . Number of children: 2  . Years of education: N/A   Occupational History  . n/a    Social History Main Topics  . Smoking status: Never Smoker  . Smokeless tobacco: Never Used  . Alcohol use Yes     Comment: occ per pt   . Drug use: No  . Sexual activity: No   Other Topics Concern  . None   Social History Narrative   Lives by herself    Additional social history is notable that she has been widowed for 35 years.  She has 2 children, 4 grandchildren, and 2 great-grandchildren.  He lives by herself.  She completed 12th grade of education.  She never smoked.  She does drink occasional wine and vodka.   Family History:  The patient's family history includes Stroke in her other.  Her mother died at 78 with a stroke.  Her father died at age 89 and had Parkinson's disease.  A brother died at age 64 with prostate cancer.  ROS General: Negative; No fevers, chills, or night sweats; mild fatigue HEENT: Negative; No changes in vision or hearing, sinus congestion, difficulty swallowing Pulmonary: Negative; No cough, wheezing, shortness of breath, hemoptysis Cardiovascular: See history of present illness GI: Negative; No nausea, vomiting, diarrhea, or abdominal pain GU: Negative; No dysuria, hematuria, or difficulty  voiding Musculoskeletal: Negative; no myalgias, joint pain, or weakness Hematologic/Oncology: Negative; no easy bruising, bleeding Endocrine: Positive for hypothyroidism; no diabetes Neuro: Negative; no changes in balance, headaches Skin: Negative; No rashes or skin lesions Psychiatric: Negative; No behavioral problems, depression Sleep: Negative; No snoring, daytime sleepiness, hypersomnolence, bruxism, restless legs, hypnogognic hallucinations, no cataplexy Other comprehensive 14 point system review is negative.   PHYSICAL EXAM:   VS:  BP (!) 164/82   Pulse (!) 58   Ht 4' 10"  (1.473 m)   Wt 127 lb (57.6 kg)   BMI 26.54 kg/m     Repeat blood pressure by me 148/84  Wt Readings from Last 3 Encounters:  06/13/16 127 lb (57.6 kg)  05/11/16 128 lb (58.1 kg)  03/13/16 126 lb (57.2 kg)     General: Alert, oriented, no distress.  Skin: normal turgor, no rashes, warm and dry HEENT: Normocephalic, atraumatic. Pupils equal round and reactive to light; sclera anicteric; extraocular muscles intact; Fundi status post bilateral cataract surgery.  Disc flat. Nose without nasal septal hypertrophy Mouth/Parynx benign; Mallinpatti scale3 Neck: No JVD, no carotid bruits; normal carotid upstroke Lungs: clear to ausculatation and percussion; no wheezing or rales Chest wall: without tenderness to palpitation Heart: PMI not displaced, RRR, s1 s2 normal, 1/6 systolic murmur, no diastolic murmur, no rubs, gallops, thrills, or heaves Abdomen: soft, nontender; no hepatosplenomehaly, BS+; abdominal aorta nontender and not dilated by palpation.  No abdominal bruit Back: no CVA tenderness Pulses 2+ Musculoskeletal: full range of motion, normal strength, no joint deformities Extremities: no clubbing cyanosis or edema, Homan's sign negative  Neurologic: grossly nonfocal; Cranial nerves grossly wnl Psychologic: Normal mood and affect    Studies/Labs Reviewed:   EKG:  EKG is ordered today. ECG  (independently read by me): SB at 58; PoorR progression V1 through V3.  Borderline first degree AV block with a PR interval of 24 ms.  QTc interval 437 ms.  Small of her Q wave.  5/3/2018ECG (independently read by me): Sinus regarding 54 bpm.  Borderline left atrial enlargement.  QS complex anteroseptally.  Recent Labs: BMP Latest Ref Rng & Units 05/19/2016 03/13/2016 03/03/2016  Glucose 65 - 99 mg/dL 121(H) 115(H) -  BUN 7 - 25 mg/dL 14 31(H) 42(A)  Creatinine 0.60 - 0.88 mg/dL 0.85 0.94 1.6(A)  Sodium 135 - 146 mmol/L 137 139 138  Potassium 3.5 - 5.3 mmol/L 4.9 4.6 4.9  Chloride 98 - 110 mmol/L 98 103 -  CO2 20 - 31 mmol/L 29 26 -  Calcium 8.6 - 10.4 mg/dL 9.6 10.1 -     Hepatic Function Latest Ref Rng & Units 05/19/2016 03/13/2016 02/11/2016  Total Protein 6.1 - 8.1 g/dL 6.7 7.5 6.8  Albumin 3.6 - 5.1 g/dL 4.3 4.2 3.8  AST 10 - 35 U/L 16 15 21   ALT 6 - 29 U/L 8 8 16   Alk Phosphatase 33 - 130 U/L 85 75 59  Total Bilirubin 0.2 - 1.2 mg/dL 0.8 0.4 0.7  Bilirubin, Direct 0.0 - 0.3 mg/dL - - -    CBC Latest Ref Rng & Units 02/11/2016 02/01/2016 07/27/2015  WBC 4.0 - 10.5 K/uL 4.3 6.0 5.7  Hemoglobin 12.0 - 15.0 g/dL 12.2 14.5 13.7  Hematocrit 36.0 - 46.0 % 37.1 43.3 41.7  Platelets 150.0 - 400.0 K/uL 146.0(L) 150.0 157.0   Lab Results  Component Value Date   MCV 99.5 02/11/2016   MCV 95.6 02/01/2016   MCV 98.9 07/27/2015   Lab Results  Component Value Date   TSH 5.29 (H) 05/19/2016   Lab Results  Component Value Date   HGBA1C 5.9 02/11/2016     BNP No results found for: BNP  ProBNP No results found for: PROBNP   Lipid Panel     Component Value Date/Time   CHOL 145 02/11/2016 1221   TRIG 99.0 02/11/2016 1221   HDL 55.60 02/11/2016 1221   CHOLHDL 3 02/11/2016 1221   VLDL 19.8 02/11/2016 1221   LDLCALC 70 02/11/2016 1221   LDLDIRECT 97.7 01/19/2010 1104     RADIOLOGY: No results found.   Additional studies/ records that were reviewed today include:  I reviewed  the records from the bowel primary  care.  I reviewed her laboratory.  The echo Doppler study was reviewed. I reviewed recent laboratory.  I also reviewed her most recent renal Doppler study as noted above.    ASSESSMENT:    1. Essential hypertension   2. Encounter for long-term (current) use of medications   3. Left renal artery stenosis (HCC)   4. Atherosclerosis of abdominal aorta ([place])   5. Hypothyroidism, unspecified type      PLAN:  Ms. Longo is an 83 year old female who has a 30 year history of hypertension and recent significant blood pressure lability.  She was  hospitalized with shortness of breath and leg swelling.  I reviewed her echo Doppler study which revealed hyperdynamic LV function and grade 1 diastolic dysfunction.  There was evidence for moderate tricuspid regurgitation with moderate pulmonary hypertension.  When I last saw her, I further titrated lisinopril from 10-15 mg.  She is continued to be on Lopressor 100 mg twice a day.  Her blood pressures improved, but still labile.  I will further titrate lisinopril to 20 mg daily.  Her most recent renal profile showed stability with a creatinine of 0.85 which actually was improved from March at 0.94 and from her hospitalization laboratory.  I reviewed her renal Doppler study.  She does have aorta.  Iliac atherosclerosis without stenosis and has at least mild stenosis in the left renal artery which is not felt to be hemodynamically significant at this time.  We will continue to monitor this in the future.  Her lipid studies remain excellent with an LDL of 70 on her current dose of lovastatin 20 mg.  She's not had any gout episodes and continues to take allopurinol.  Her thyroid dose was increased from 25-50 g with her TSH being increased at 5.29.  Since I'm increasing her lisinopril, in 2 weeks she will undergo repeat be met in that time I will repeat a TSH level which would be approximately 5 weeks since her Synthroid dose elevation.   She will continue to monitor her blood pressure at home.  As long as she remains stable I will see her in 4 months for reevaluation.   Medication Adjustments/Labs and Tests Ordered: Current medicines are reviewed at length with the patient today.  Concerns regarding medicines are outlined above.  Medication changes, Labs and Tests ordered today are listed in the Patient Instructions below. Patient Instructions  Medication Instructions:   INCREASE lisinopril to 33m DAILY  Labwork:   BMET, TSH in 2 weeks (you do not need to fast)  Testing/Procedures:  none  Follow-Up:  4 months with Dr. KClaiborne Billings If you need a refill on your cardiac medications before your next appointment, please call your pharmacy.      Signed, TShelva Majestic MD  06/13/2016 9:04 AM    [address] [place]  [id number]: [phone]

## 2016-06-16 ENCOUNTER — Other Ambulatory Visit: Payer: Self-pay | Admitting: Family Medicine

## 2016-06-16 DIAGNOSIS — I10 Essential (primary) hypertension: Secondary | ICD-10-CM

## 2016-06-19 NOTE — Telephone Encounter (Signed)
Pt wanted to know the status of refill for Metoprolol Tartrate 100 MG, pt mentioned took her last pill today and already went to pharmacy to pick up rx but pharmacy hasn't received the approval yet. Please send refill to [facility] ASAP.

## 2016-06-29 ENCOUNTER — Emergency Department (HOSPITAL_COMMUNITY)
Admission: EM | Admit: 2016-06-29 | Discharge: 2016-06-29 | Disposition: A | Discharge status: Home / Self Care | Payer: Medicare Other | Attending: Emergency Medicine | Admitting: Emergency Medicine

## 2016-06-29 ENCOUNTER — Encounter (HOSPITAL_COMMUNITY): Payer: Self-pay

## 2016-06-29 ENCOUNTER — Telehealth: Payer: Self-pay | Admitting: Nurse Practitioner

## 2016-06-29 ENCOUNTER — Telehealth: Payer: Self-pay | Admitting: Cardiovascular Disease

## 2016-06-29 ENCOUNTER — Ambulatory Visit: Payer: Medicare Other

## 2016-06-29 DIAGNOSIS — Z7982 Long term (current) use of aspirin: Secondary | ICD-10-CM | POA: Insufficient documentation

## 2016-06-29 DIAGNOSIS — R110 Nausea: Secondary | ICD-10-CM | POA: Diagnosis not present

## 2016-06-29 DIAGNOSIS — I160 Hypertensive urgency: Secondary | ICD-10-CM | POA: Diagnosis not present

## 2016-06-29 DIAGNOSIS — I10 Essential (primary) hypertension: Secondary | ICD-10-CM

## 2016-06-29 DIAGNOSIS — Z79899 Other long term (current) drug therapy: Secondary | ICD-10-CM | POA: Diagnosis not present

## 2016-06-29 DIAGNOSIS — R112 Nausea with vomiting, unspecified: Secondary | ICD-10-CM | POA: Diagnosis not present

## 2016-06-29 DIAGNOSIS — R1114 Bilious vomiting: Secondary | ICD-10-CM

## 2016-06-29 DIAGNOSIS — E119 Type 2 diabetes mellitus without complications: Secondary | ICD-10-CM | POA: Insufficient documentation

## 2016-06-29 LAB — CBC WITH DIFFERENTIAL/PLATELET
BASOS ABS: 0 10*3/uL (ref 0.0–0.1)
Basophils Relative: 0 %
Eosinophils Absolute: 0 10*3/uL (ref 0.0–0.7)
Eosinophils Relative: 0 %
HCT: 45.8 % (ref 36.0–46.0)
HEMOGLOBIN: 15.9 g/dL — AB (ref 12.0–15.0)
LYMPHS PCT: 16 %
Lymphs Abs: 1 10*3/uL (ref 0.7–4.0)
MCH: 32.4 pg (ref 26.0–34.0)
MCHC: 34.7 g/dL (ref 30.0–36.0)
MCV: 93.5 fL (ref 78.0–100.0)
MONO ABS: 0.3 10*3/uL (ref 0.1–1.0)
Monocytes Relative: 5 %
NEUTROS PCT: 79 %
Neutro Abs: 4.7 10*3/uL (ref 1.7–7.7)
PLATELETS: 148 10*3/uL — AB (ref 150–400)
RBC: 4.9 MIL/uL (ref 3.87–5.11)
RDW: 15.1 % (ref 11.5–15.5)
WBC: 6 10*3/uL (ref 4.0–10.5)

## 2016-06-29 LAB — URINALYSIS, ROUTINE W REFLEX MICROSCOPIC
BILIRUBIN URINE: NEGATIVE
GLUCOSE, UA: 50 mg/dL — AB
Ketones, ur: 20 mg/dL — AB
LEUKOCYTES UA: NEGATIVE
NITRITE: NEGATIVE
PH: 6 (ref 5.0–8.0)
Protein, ur: >=300 mg/dL — AB
SPECIFIC GRAVITY, URINE: 1.013 (ref 1.005–1.030)

## 2016-06-29 LAB — COMPREHENSIVE METABOLIC PANEL
ALBUMIN: 4.7 g/dL (ref 3.5–5.0)
ALT: 14 U/L (ref 14–54)
AST: 29 U/L (ref 15–41)
Alkaline Phosphatase: 109 U/L (ref 38–126)
Anion gap: 19 — ABNORMAL HIGH (ref 5–15)
BUN: 13 mg/dL (ref 6–20)
CHLORIDE: 96 mmol/L — AB (ref 101–111)
CO2: 24 mmol/L (ref 22–32)
Calcium: 9.5 mg/dL (ref 8.9–10.3)
Creatinine, Ser: 0.68 mg/dL (ref 0.44–1.00)
GFR calc Af Amer: >60 mL/min (ref >60)
GLUCOSE: 129 mg/dL — AB (ref 65–99)
Potassium: 3.6 mmol/L (ref 3.5–5.1)
SODIUM: 139 mmol/L (ref 135–145)
Total Bilirubin: 1.6 mg/dL — ABNORMAL HIGH (ref 0.3–1.2)
Total Protein: 8 g/dL (ref 6.5–8.1)

## 2016-06-29 LAB — LIPASE, BLOOD: Lipase: 22 U/L (ref 11–51)

## 2016-06-29 LAB — ETHANOL

## 2016-06-29 MED ORDER — SODIUM CHLORIDE 0.9 % IV BOLUS (SEPSIS)
1000.0000 mL | Freq: Once | INTRAVENOUS | Status: AC
  Administered 2016-06-29: 1000 mL via INTRAVENOUS

## 2016-06-29 MED ORDER — ONDANSETRON 4 MG PO TBDP: 4.0000 mg | ORAL_TABLET | Freq: Three times a day (TID) | ORAL | 0 refills | Status: AC | PRN

## 2016-06-29 MED ORDER — ONDANSETRON HCL 4 MG/2ML IJ SOLN
4.0000 mg | Freq: Once | INTRAMUSCULAR | Status: AC
  Administered 2016-06-29: 4 mg via INTRAVENOUS
  Filled 2016-06-29: qty 2

## 2016-06-29 MED ORDER — HYDRALAZINE HCL 20 MG/ML IJ SOLN
10.0000 mg | Freq: Once | INTRAMUSCULAR | Status: AC
  Administered 2016-06-29: 10 mg via INTRAVENOUS
  Filled 2016-06-29: qty 0.5

## 2016-06-29 NOTE — Progress Notes (Signed)
Pt here for Bp check Bp is running high spoke with DOD ([place]) he ordered pt to increase Lisinopril to 40mg  daily until appt next week's appt with Theodore Demark 07-05-16 @8am . Gave pt BP log to log BP and bring with her to this visit along with her BP cuff.

## 2016-06-29 NOTE — ED Triage Notes (Addendum)
Pt BP was 188/90 at her cardiologist appt today. They increased her BP medication until her next appointment. Pt followed their directions and all afternoon she has felt restless. She retook her BP at 545 pm and it was 226/108. Pt also c/o N/V. A&Ox4. Blood work performed earlier today. Denies chest pain or SOB.

## 2016-06-29 NOTE — Telephone Encounter (Signed)
S/w Lodema Pilot (daughter) she states that pt is coming here today to have her lab work drawn. She states that pt has told her the her BP has been running 190's/80's "for the last couple days" she states that her ankles have been swollen too. She states that her PCP's office took her off her HCTZ in [place] and she is concerned about this. She states that she is feeling fatigued and weak and seems to be walking much "slower and norma;" she has been eating and drinking well-per daughter. I have made her an appt today for BP check while she is here for lab draw, and also made an appt to see Theodore Demark 07-05-16 @ 8am.

## 2016-06-29 NOTE — ED Provider Notes (Signed)
[facility]-EMERGENCY DEPT Provider Note   CSN: [id number] Arrival date & time: 06/29/16  1839     History   Chief Complaint Chief Complaint  Patient presents Julie  . Julie [name]    Julie CHAUNA OSORIA is a 83 y.o. female.  Julie  Patient presents Julie her daughter who provides additional details of Julie [name] history of present illness after my initial evaluation of Julie [name] patient Julie [name] patient herself notes a history of Julie [name], Julie multiple medication changes recently performed in an attempt to control it. Over Julie [name] past few days patient has had particularly elevated blood pressure numbers, without substantial change in condition. Julie [name], patient was advised to start doubling her lisinopril dose up to 40 mg, daily. Julie [name], Julie Julie [name], Julie has several episodes of vomiting. She denies abdominal Julie [name], Julie Julie [name], Julie Julie [name], Julie Julie [name], Julie Julie [name], Julie disorientation. After my initial evaluation Julie [name] patient's daughter requests a private work. She conveys that patient has depression, started on Zoloft Julie 3 months ago, Julie has had some improvement in her overall condition. However, she states that when Julie [name] patient is sedentary, as she has been for Julie [name] past 2 days due to excessive heat, Julie [name] patient tends to drink vodka.   Past Medical History:  Diagnosis Date  . Diabetes mellitus   . Gout   . Hyperlipidemia   . Julie [name]   . Osteopenia     Patient Active Problem List   Diagnosis Date Noted  . Depression 02/11/2016  . Diabetes type 2, controlled (HCC) 04/04/2011  . POSTMENOPAUSAL STATUS 10/28/2008  . HYPOKALEMIA 09/30/2007  . EDEMA 08/22/2007  . Hyperlipidemia LDL goal <70 08/01/2006  . GOUT 08/01/2006  . Essential Julie [name] 08/01/2006  . OSTEOPENIA 08/01/2006    Past Surgical History:  Procedure Laterality Date  . CATARACT EXTRACTION    . CATARACT EXTRACTION Left 7/15    OB History    Julie data available       Home  Medications    Prior to Admission medications   Medication Sig Start Date End Date Taking? Authorizing Provider  allopurinol (ZYLOPRIM) 100 MG tablet TAKE 2 TABLETS BY MOUTH ONCE DAILY Patient taking differently: TAKE 100 MG BY MOUTH ONCE EVERY MORNING 06/06/16  Yes Seabron Spates R, DO  aspirin 81 MG tablet Take 81 mg by mouth daily.     Yes [provider]  fish oil-omega-3 fatty acids 1000 MG capsule Take 2 g by mouth daily.     Yes [provider]  glucose blood (ONE TOUCH ULTRA TEST) test strip USE ONE STRIP TO CHECK GLUCOSE TWICE DAILY. Dx: E11.9 02/22/16  Yes Donato Schultz, DO  levothyroxine (SYNTHROID, LEVOTHROID) 50 MCG tablet Take 1 tablet (50 mcg total) by mouth daily. Patient taking differently: Take 50 mcg by mouth daily before breakfast.  05/22/16  Yes Zola Button, Grayling Congress, DO  lisinopril (PRINIVIL,ZESTRIL) 20 MG tablet Take 1 tablet (20 mg total) by mouth daily. Patient taking differently: Take 20 mg by mouth daily after breakfast.  06/13/16  Yes Lennette Bihari, MD  lovastatin (MEVACOR) 20 MG tablet TAKE ONE TABLET BY MOUTH AT BEDTIME Patient taking differently: TAKE 20 MG  BY MOUTH AT BEDTIME 04/14/16  Yes Seabron Spates R, DO  metoprolol tartrate (LOPRESSOR) 100 MG tablet TAKE 1 TABLET BY MOUTH TWICE DAILY Patient taking differently: TAKE 100 MG BY MOUTH TWICE DAILY 06/19/16  Yes Lowne Chase, Yvonne R, DO  ONETOUCH DELICA LANCETS 33G MISC USE  ONE  TO CHECK GLUCOSE TWICE DAILY. Dx:E11.9 03/11/15  Yes Seabron Spates R, DO  pantoprazole (PROTONIX) 40 MG tablet Take 1 tablet (40 mg total) by mouth daily. Patient taking differently: Take 40 mg by mouth daily after breakfast.  02/11/16  Yes Zola Button, Yvonne R, DO  sertraline (ZOLOFT) 50 MG tablet TAKE ONE TABLET BY MOUTH ONCE DAILY Patient taking differently: TAKE 50 MG BY MOUTH EACH MORNING 06/06/16  Yes Donato Schultz, DO    Family History Family History  Problem Relation Age of Onset  .  Stroke Other     Social History Social History  Substance Use Topics  . Smoking status: Never Smoker  . Smokeless tobacco: Never Used  . Alcohol use Yes     Comment: occ per pt      Allergies   Patient has Julie known allergies.   Review of Systems Review of Systems  Constitutional:       Per Julie, otherwise negative  HENT:       Per Julie, otherwise negative  Respiratory:       Per Julie, otherwise negative  Cardiovascular:       Per Julie, otherwise negative  Gastrointestinal: Positive for Julie [name] Julie vomiting.  Endocrine:       Negative aside from Julie  Genitourinary:       Neg aside from Julie   Musculoskeletal:       Per Julie, otherwise negative  Skin: Negative.   Neurological: Negative for syncope.  Psychiatric/Behavioral: Positive for dysphoric mood.     Physical Exam Updated Vital Signs BP (!) 144/54   Pulse (!) 58   Temp 98 F (36.7 C) (Oral)   Resp 17   SpO2 96%   Physical Exam  Constitutional: She is oriented to person, place, Julie time. She appears well-developed Julie well-nourished. Julie distress.  HENT:  Head: Normocephalic Julie atraumatic.  Eyes: Conjunctivae Julie EOM are normal.  Cardiovascular: Normal rate Julie regular rhythm.   Pulmonary/Julie: Effort normal Julie breath sounds normal. Julie stridor. Julie respiratory distress.  Abdominal: She exhibits Julie distension.  Musculoskeletal: She exhibits Julie edema.  Neurological: She is alert Julie oriented to person, place, Julie time. Julie cranial nerve deficit.  Skin: Skin is warm Julie dry.  Psychiatric: She has a normal mood Julie affect.  Nursing note Julie vitals reviewed.    ED Treatments / Results  Labs (all labs ordered are listed, but only abnormal results are displayed) Labs Reviewed  COMPREHENSIVE METABOLIC PANEL - Abnormal; Notable for Julie [name] following:       Result Value   Chloride 96 (*)    Glucose, Bld 129 (*)    Total Bilirubin 1.6 (*)    Anion gap 19 (*)    All other components within normal limits  CBC  Julie DIFFERENTIAL/PLATELET - Abnormal; Notable for Julie [name] following:    Hemoglobin 15.9 (*)    Platelets 148 (*)    All other components within normal limits  URINALYSIS, ROUTINE W REFLEX MICROSCOPIC - Abnormal; Notable for Julie [name] following:    Glucose, UA 50 (*)    Hgb urine dipstick SMALL (*)    Ketones, ur 20 (*)    Protein, ur >=300 (*)    Bacteria, UA RARE (*)    Squamous Epithelial / LPF 0-5 (*)    All other components within normal limits  ETHANOL  LIPASE, BLOOD    EKG  EKG Interpretation None       Procedures Procedures (including critical  care time)  Medications Ordered in ED Medications  ondansetron (ZOFRAN) injection 4 mg (not administered)  sodium chloride 0.9 % bolus 1,000 mL (0 mLs Intravenous Stopped 06/29/16 2149)  ondansetron (ZOFRAN) injection 4 mg (4 mg Intravenous Given 06/29/16 2021)  hydrALAZINE (APRESOLINE) injection 10 mg (10 mg Intravenous Given 06/29/16 2024)     Initial Impression / Assessment Julie Plan / ED Course  I have reviewed Julie [name] triage vital signs Julie Julie [name] nursing notes.  Pertinent labs & imaging results that were available during my care of Julie [name] patient were reviewed by me Julie considered in my medical decision making (see chart for details).  10:27 PM Patient is awake alert, comfortable appearing, Julie additional episodes of vomiting. Labs are reassuring, Julie Julie evidence for hepatic dysfunction, Julie leukocytosis. Patient has had substantial reduction in her blood pressure is well. We had a lengthy conversation Julie Julie [name] patient's daughter present Julie Julie [name] need for close outpatient follow-up, taking blood pressure medication as directed, stay well-hydrated, Julie Julie Julie [name] aforementioned reassuring findings, Julie [name] patient was discharged in stable condition.  Final Clinical Impressions(s) / ED Diagnoses  Julie [name] Julie vomiting Hypertensive urgency   Gerhard Munch, MD 06/29/16 2227

## 2016-06-29 NOTE — Discharge Instructions (Signed)
As discussed, your evaluation today has been largely reassuring.  But, it is important that you monitor your condition carefully, and do not hesitate to return to the ED if you develop new, or concerning changes in your condition. ? ?Otherwise, please follow-up with your physician for appropriate ongoing care. ? ?

## 2016-06-29 NOTE — Telephone Encounter (Signed)
New message    Pt daughter is calling for pt. She states that she is bringing mother for lab work today and would like her mothers bp checked while she is here. Please call.  Pt c/o swelling: STAT is pt has developed SOB within 24 hours  1. How long have you been experiencing swelling? Daughter noticed yesterday  2. Where is the swelling located? ankles  3.  Are you currently taking a "fluid pill"? She does not know right off the top of her head  4.  Are you currently SOB? no  5.  Have you traveled recently? no  Pt c/o BP issue: STAT if pt c/o blurred vision, one-sided weakness or slurred speech  1. What are your last 5 BP readings? 190/--daughter does not know the bottom number  2. Are you having any other symptoms (ex. Dizziness, headache, blurred vision, passed out)? tired  3. What is your BP issue? Pt daughter states that pt told her her bp has been running high this week.

## 2016-06-29 NOTE — Telephone Encounter (Signed)
   Pts dtr called.  BP has been markedly elevated today despite taking extra dose of lisinopril.  Ms. Benko feels poorly and dtr is driving to [facility] for eval.  I agreed with this action.  Caller verbalized understanding and was grateful for the call back.  Nicolasa Ducking, NP 06/29/2016, 6:23 PM

## 2016-06-30 LAB — BASIC METABOLIC PANEL
BUN / CREAT RATIO: 15 (ref 12–28)
BUN: 10 mg/dL (ref 8–27)
CO2: 21 mmol/L (ref 20–29)
Calcium: 9.3 mg/dL (ref 8.7–10.3)
Chloride: 98 mmol/L (ref 96–106)
Creatinine, Ser: 0.67 mg/dL (ref 0.57–1.00)
GFR, EST AFRICAN AMERICAN: 93 mL/min/{1.73_m2} (ref >59)
GFR, EST NON AFRICAN AMERICAN: 81 mL/min/{1.73_m2} (ref >59)
Glucose: 93 mg/dL (ref 65–99)
Potassium: 3.9 mmol/L (ref 3.5–5.2)
Sodium: 144 mmol/L (ref 134–144)

## 2016-06-30 LAB — TSH: TSH: 3.17 u[IU]/mL (ref 0.450–4.500)

## 2016-06-30 MED ORDER — LISINOPRIL 20 MG PO TABS: 40.0000 mg | ORAL_TABLET | Freq: Every day | ORAL | 11 refills | Status: DC

## 2016-07-05 ENCOUNTER — Ambulatory Visit (INDEPENDENT_AMBULATORY_CARE_PROVIDER_SITE_OTHER): Payer: Medicare Other | Admitting: Physician Assistant

## 2016-07-05 ENCOUNTER — Encounter: Payer: Self-pay | Admitting: Physician Assistant

## 2016-07-05 VITALS — BP 210/98 | HR 66 | Ht <= 58 in | Wt 127.0 lb

## 2016-07-05 DIAGNOSIS — Z79899 Other long term (current) drug therapy: Secondary | ICD-10-CM | POA: Diagnosis not present

## 2016-07-05 DIAGNOSIS — I10 Essential (primary) hypertension: Secondary | ICD-10-CM | POA: Diagnosis not present

## 2016-07-05 DIAGNOSIS — I701 Atherosclerosis of renal artery: Secondary | ICD-10-CM

## 2016-07-05 MED ORDER — HYDROCHLOROTHIAZIDE 25 MG PO TABS: 12.5000 mg | ORAL_TABLET | Freq: Every day | ORAL | 6 refills | Status: AC

## 2016-07-05 NOTE — Progress Notes (Signed)
Cardiology Office Note   Date:  07/05/2016   ID:  Julie [name], DOB 05/07/1929, MRN [id number]  PCP:  Donato Schultz, DO  Cardiologist:  Dr. Tresa Endo 06/13/2016  Theodore Demark, PA-C   Chief Complaint  Patient presents with  . Follow-up    pt c/o High BP    History of Present Illness: Julie [name] is a 83 y.o. female with a history of DM, HTN, HLD, gout, nl EF by echo 03/2016.  06/05, Dr Tresa Endo saw and increased lisinopril.  [name] presents for cardiology an BP follow up.  She took the extra lisinopril that day, but developed N&V. She went to the [facility] ER and her labs were checked. UA showed protein, not infected. CMET w/ bilirubin 1.6, otherwise ok. Sx resolved and she has been on the 40 mg lisinopril since then.   Pt has BP sheets with her, SBP range 156-226 w/ 1 reading 135. DBP 69-99. She has felt tired, has not been SOB. No LE edema. No PND or orthopnea. No chest pain.   She was on HCTZ (?25 mg) until January 2018. At that time, she had recently had a GI illness and her Na was 128, BUN/Cr were 28/1.07, unusual for her. However, she had been on HCTZ for years with no problems. Pt feels she would tolerate it again.   Her general is not bothering her much right now. Other than her blood pressure and systemic complaints of fatigue she is doing very well.   Past Medical History:  Diagnosis Date  . Diabetes mellitus   . Gout   . Hyperlipidemia   . Hypertension   . Osteopenia     Past Surgical History:  Procedure Laterality Date  . CATARACT EXTRACTION    . CATARACT EXTRACTION Left 7/15    Medication Sig  . allopurinol (ZYLOPRIM) 100 MG tablet TAKE 2 TABLETS BY MOUTH ONCE DAILY (Patient taking differently: TAKE 100 MG BY MOUTH ONCE EVERY MORNING)  . aspirin 81 MG tablet Take 81 mg by mouth daily.    . fish oil-omega-3 fatty acids 1000 MG capsule Take 2 g by mouth daily.    Marland Kitchen glucose blood (ONE TOUCH ULTRA TEST) test strip USE ONE STRIP TO CHECK GLUCOSE  TWICE DAILY. Dx: E11.9  . levothyroxine (SYNTHROID, LEVOTHROID) 50 MCG tablet Take 1 tablet (50 mcg total) by mouth daily. (Patient taking differently: Take 50 mcg by mouth daily before breakfast. )  . lisinopril (PRINIVIL,ZESTRIL) 20 MG tablet Take 2 tablets (40 mg total) by mouth daily.  Marland Kitchen lovastatin (MEVACOR) 20 MG tablet TAKE ONE TABLET BY MOUTH AT BEDTIME (Patient taking differently: TAKE 20 MG  BY MOUTH AT BEDTIME)  . metoprolol tartrate (LOPRESSOR) 100 MG tablet TAKE 1 TABLET BY MOUTH TWICE DAILY (Patient taking differently: TAKE 100 MG BY MOUTH TWICE DAILY)  . ondansetron (ZOFRAN ODT) 4 MG disintegrating tablet Take 1 tablet (4 mg total) by mouth every 8 (eight) hours as needed for nausea or vomiting.  Letta Pate DELICA LANCETS 33G MISC USE ONE  TO CHECK GLUCOSE TWICE DAILY. Dx:E11.9  . pantoprazole (PROTONIX) 40 MG tablet Take 1 tablet (40 mg total) by mouth daily. (Patient taking differently: Take 40 mg by mouth daily after breakfast. )  . sertraline (ZOLOFT) 50 MG tablet TAKE ONE TABLET BY MOUTH ONCE DAILY (Patient taking differently: TAKE 50 MG BY MOUTH EACH MORNING)   No current facility-administered medications for this visit.     Allergies:  Patient has no known allergies.    Social History:  The patient  reports that she has never smoked. She has never used smokeless tobacco. She reports that she drinks alcohol. She reports that she does not use drugs.   Family History:  The patient's family history includes Stroke in her other.    ROS:  Please see the history of present illness. All other systems are reviewed and negative.    PHYSICAL EXAM: VS:  BP (!) 210/98 (BP Location: Right Arm, Patient Position: Sitting, Cuff Size: Normal)   Pulse 66   Ht 4\' 10"  (1.473 m)   Wt 127 lb (57.6 kg)   BMI 26.54 kg/m  , BMI Body mass index is 26.54 kg/m. GEN: Well nourished, well developed, female in no acute distress  HEENT: normal for age  Neck: no JVD, no carotid bruit, no  masses Cardiac: RRR; Soft murmur, no rubs, or gallops Respiratory:  Decreased breath sounds bases bilaterally, normal work of breathing GI: soft, nontender, nondistended, + BS MS: no deformity or atrophy; no edema; distal pulses are 2+ in all 4 extremities   Skin: warm and dry, no rash Neuro:  Strength and sensation are intact Psych: euthymic mood, full affect   EKG:  EKG is not ordered today.   Recent Labs: 06/29/2016: ALT 14; BUN 13; Creatinine, Ser 0.68; Hemoglobin 15.9; Platelets 148; Potassium 3.6; Sodium 139; TSH 3.170  CMP Latest Ref Rng & Units 06/29/2016 06/29/2016 05/19/2016  Glucose 65 - 99 mg/dL 161(W) 93 960(A)  BUN 6 - 20 mg/dL 13 10 14   Creatinine 0.44 - 1.00 mg/dL 5.40 9.81 1.91  Sodium 135 - 145 mmol/L 139 144 137  Potassium 3.5 - 5.1 mmol/L 3.6 3.9 4.9  Chloride 101 - 111 mmol/L 96(L) 98 98  CO2 22 - 32 mmol/L 24 21 29   Calcium 8.9 - 10.3 mg/dL 9.5 9.3 9.6  Total Protein 6.5 - 8.1 g/dL 8.0 - 6.7  Total Bilirubin 0.3 - 1.2 mg/dL 4.7(W) - 0.8  Alkaline Phos 38 - 126 U/L 109 - 85  AST 15 - 41 U/L 29 - 16  ALT 14 - 54 U/L 14 - 8    Urinalysis    Component Value Date/Time   COLORURINE YELLOW 06/29/2016 2005   APPEARANCEUR CLEAR 06/29/2016 2005   LABSPEC 1.013 06/29/2016 2005   PHURINE 6.0 06/29/2016 2005   GLUCOSEU 50 (A) 06/29/2016 2005   HGBUR SMALL (A) 06/29/2016 2005   HGBUR negative 12/01/2008 0912   BILIRUBINUR NEGATIVE 06/29/2016 2005   BILIRUBINUR neg 03/13/2016 1434   KETONESUR 20 (A) 06/29/2016 2005   PROTEINUR >=300 (A) 06/29/2016 2005   UROBILINOGEN 0.2 03/13/2016 1434   UROBILINOGEN 0.2 12/01/2008 0912   NITRITE NEGATIVE 06/29/2016 2005   LEUKOCYTESUR NEGATIVE 06/29/2016 2005   Lipid Panel    Component Value Date/Time   CHOL 145 02/11/2016 1221   TRIG 99.0 02/11/2016 1221   HDL 55.60 02/11/2016 1221   CHOLHDL 3 02/11/2016 1221   VLDL 19.8 02/11/2016 1221   LDLCALC 70 02/11/2016 1221   LDLDIRECT 97.7 01/19/2010 1104     Wt Readings  from Last 3 Encounters:  07/05/16 127 lb (57.6 kg)  06/13/16 127 lb (57.6 kg)  05/11/16 128 lb (58.1 kg)     Other studies Reviewed: Additional studies/ records that were reviewed today include: Office notes, hospital records and testing.  ASSESSMENT AND PLAN:  1.  Hypertension: Her blood pressure is quite high on lisinopril 20 mg, 2 tablets daily plus metoprolol  100 mg twice a day. She reports that she is to have good blood pressure control on HCTZ, but this was stopped in the setting of abnormal renal function and hyponatremia. However, this was in setting of an acute illness. She had been on the medication for years without any difficulties. We will try restarting HCTZ 12.5 mg daily. She will come back in 2 weeks for a BMET. She is to report her blood pressures using My Chart. If her labs are good, and her blood pressure is not yet controlled on 12.5 mg daily, we will go up to 25 mg daily. She believes the 25 mg daily plus her previous dose. I will also ask her to take her lisinopril as 20 mg twice a day to try to limit the peaks and valleys blood pressure control.  If this does not work, we may want to cut back on the lisinopril and try amlodipine. I would avoid hydralazine to minimize orthostatic blood pressure problems.  She is encouraged to try to maintain her activity level although she was advised not to do anything strenuous. She gets any symptoms such as headaches, dizziness or unilateral weakness, she is to call 911.   Current medicines are reviewed at length with the patient today.  The patient has concerns regarding medicines. Concerns were addressed  The following changes have been made:  Add HCTZ  Labs/ tests ordered today include:   Orders Placed This Encounter  Procedures  . Basic Metabolic Panel (BMET)     Disposition:   FU with Dr. Tresa Endo and myself  Signed, Leanna Battles  07/05/2016 1:08 PM    [facility] Phone: [phone];  Fax: [phone]  This note was written with the assistance of speech recognition software. Please excuse any transcriptional errors.

## 2016-07-05 NOTE — Patient Instructions (Signed)
Medication Instructions:  START hydrochlorothiazide (HCTZ) 12.5 mg (1/2 tablet) one time daily.  Start taking your lisinopril 20 mg (1 tablet) two times daily.   Labwork: Return in 1 WEEK for blood work [employer]).  Follow-Up: Your physician recommends that you schedule a follow-up appointment in: 1 MONTH with Theodore Demark PA and 4 MONTHS with Dr. Tresa Endo.    Any Other Special Instructions Will Be Listed Below (If Applicable).     If you need a refill on your cardiac medications before your next appointment, please call your pharmacy.

## 2016-07-07 ENCOUNTER — Encounter: Payer: Self-pay | Admitting: *Deleted

## 2016-07-14 ENCOUNTER — Other Ambulatory Visit: Payer: Self-pay | Admitting: Family Medicine

## 2016-07-19 DIAGNOSIS — Z79899 Other long term (current) drug therapy: Secondary | ICD-10-CM | POA: Diagnosis not present

## 2016-07-20 LAB — BASIC METABOLIC PANEL
BUN/Creatinine Ratio: 22 (ref 12–28)
BUN: 18 mg/dL (ref 8–27)
CALCIUM: 9.6 mg/dL (ref 8.7–10.3)
CHLORIDE: 96 mmol/L (ref 96–106)
CO2: 26 mmol/L (ref 20–29)
Creatinine, Ser: 0.82 mg/dL (ref 0.57–1.00)
GFR calc non Af Amer: 66 mL/min/{1.73_m2} (ref >59)
GFR, EST AFRICAN AMERICAN: 76 mL/min/{1.73_m2} (ref >59)
Glucose: 133 mg/dL — ABNORMAL HIGH (ref 65–99)
POTASSIUM: 4.5 mmol/L (ref 3.5–5.2)
Sodium: 139 mmol/L (ref 134–144)

## 2016-08-01 ENCOUNTER — Other Ambulatory Visit: Payer: Self-pay | Admitting: Family Medicine

## 2016-08-01 DIAGNOSIS — M109 Gout, unspecified: Secondary | ICD-10-CM

## 2016-08-02 ENCOUNTER — Ambulatory Visit (INDEPENDENT_AMBULATORY_CARE_PROVIDER_SITE_OTHER): Payer: Medicare Other | Admitting: Physician Assistant

## 2016-08-02 ENCOUNTER — Encounter: Payer: Self-pay | Admitting: Physician Assistant

## 2016-08-02 VITALS — BP 140/82 | HR 55 | Ht <= 58 in | Wt 128.4 lb

## 2016-08-02 DIAGNOSIS — I10 Essential (primary) hypertension: Secondary | ICD-10-CM | POA: Diagnosis not present

## 2016-08-02 DIAGNOSIS — Z79899 Other long term (current) drug therapy: Secondary | ICD-10-CM

## 2016-08-02 DIAGNOSIS — I701 Atherosclerosis of renal artery: Secondary | ICD-10-CM

## 2016-08-02 LAB — BASIC METABOLIC PANEL
BUN / CREAT RATIO: 18 (ref 12–28)
BUN: 16 mg/dL (ref 8–27)
CHLORIDE: 97 mmol/L (ref 96–106)
CO2: 25 mmol/L (ref 20–29)
Calcium: 9.6 mg/dL (ref 8.7–10.3)
Creatinine, Ser: 0.88 mg/dL (ref 0.57–1.00)
GFR, EST AFRICAN AMERICAN: 70 mL/min/{1.73_m2} (ref >59)
GFR, EST NON AFRICAN AMERICAN: 61 mL/min/{1.73_m2} (ref >59)
Glucose: 111 mg/dL — ABNORMAL HIGH (ref 65–99)
Potassium: 4 mmol/L (ref 3.5–5.2)
Sodium: 139 mmol/L (ref 134–144)

## 2016-08-02 MED ORDER — AMLODIPINE BESYLATE 2.5 MG PO TABS: 2.5000 mg | ORAL_TABLET | Freq: Every day | ORAL | 3 refills | Status: DC

## 2016-08-02 MED ORDER — LISINOPRIL 40 MG PO TABS: 40.0000 mg | ORAL_TABLET | Freq: Every day | ORAL | 6 refills | Status: DC

## 2016-08-02 NOTE — Progress Notes (Signed)
Cardiology Office Note   Date:  08/02/2016   ID:  Julie [name], DOB 1929-07-07, MRN [id number]  PCP:  Donato Schultz, DO  Cardiologist:  Dr Tresa Endo 06/13/2016  Theodore Demark, PA-C 07/05/2016  Chief Complaint  Patient presents with  . Follow-up    History of Present Illness: Julie [name] is a 83 y.o. female with a history of  DM, HTN, HLD, gout, nl EF by echo 03/2016.  06/05 & 06/27 office visits for med titration, started on HCTZ, renal function ok on lisinopril  Julie [name] presents for cardiology follow up. Her daughter is with her.  She is doing very well. She is not having chest pain or SOB. She is not having cramping pain or problems with the medications. No LE edema, orthopnea or PND.  She takes her BP twice a day, am before eating and taking meds; pm before bedtime.  She lives alone in her home. She does not exercise. She is going to start keeping her daughter's dog during the day. She has not had her meds today, yet. She feels less tired now.  Her am SBPs are 130s-160s, mostly < 160. Her pm SBPs are 120s-160s.   Past Medical History:  Diagnosis Date  . Diabetes mellitus   . Gout   . Hyperlipidemia   . Hypertension   . Osteopenia     Past Surgical History:  Procedure Laterality Date  . CATARACT EXTRACTION    . CATARACT EXTRACTION Left 7/15    Current Outpatient Prescriptions  Medication Sig Dispense Refill  . allopurinol (ZYLOPRIM) 100 MG tablet TAKE 2 TABLETS BY MOUTH ONCE DAILY 180 tablet 0  . aspirin 81 MG tablet Take 81 mg by mouth daily.      . fish oil-omega-3 fatty acids 1000 MG capsule Take 2 g by mouth daily.      Marland Kitchen glucose blood (ONE TOUCH ULTRA TEST) test strip USE ONE STRIP TO CHECK GLUCOSE TWICE DAILY. Dx: E11.9 100 each 11  . hydrochlorothiazide (HYDRODIURIL) 25 MG tablet Take 0.5 tablets (12.5 mg total) by mouth daily. 30 tablet 6  . levothyroxine (SYNTHROID, LEVOTHROID) 50 MCG tablet Take 1 tablet (50 mcg total) by mouth  daily. (Patient taking differently: Take 50 mcg by mouth daily before breakfast. ) 30 tablet 2  . lisinopril (PRINIVIL,ZESTRIL) 20 MG tablet Take 2 tablets (40 mg total) by mouth daily. 30 tablet 11  . lovastatin (MEVACOR) 20 MG tablet TAKE ONE TABLET BY MOUTH AT BEDTIME (Patient taking differently: TAKE 20 MG  BY MOUTH AT BEDTIME) 90 tablet 2  . metoprolol tartrate (LOPRESSOR) 100 MG tablet TAKE 1 TABLET BY MOUTH TWICE DAILY (Patient taking differently: TAKE 100 MG BY MOUTH TWICE DAILY) 180 tablet 0  . ondansetron (ZOFRAN ODT) 4 MG disintegrating tablet Take 1 tablet (4 mg total) by mouth every 8 (eight) hours as needed for nausea or vomiting. 20 tablet 0  . ONETOUCH DELICA LANCETS 33G MISC USE ONE  TO CHECK GLUCOSE TWICE DAILY. Dx:E11.9 100 each 11  . pantoprazole (PROTONIX) 40 MG tablet TAKE ONE TABLET BY MOUTH ONCE DAILY 90 tablet 0  . sertraline (ZOLOFT) 50 MG tablet TAKE ONE TABLET BY MOUTH ONCE DAILY (Patient taking differently: TAKE 50 MG BY MOUTH EACH MORNING) 30 tablet 3   No current facility-administered medications for this visit.     Allergies:   Patient has no known allergies.    Social History:  The patient  reports that she has never  smoked. She has never used smokeless tobacco. She reports that she drinks alcohol. She reports that she does not use drugs.   Family History:  The patient's family history includes Stroke in her other.    ROS:  Please see the history of present illness. All other systems are reviewed and negative.    PHYSICAL EXAM: VS:  BP 140/82 (BP Location: Left Arm, Cuff Size: Normal)   Pulse (!) 55   Ht 4\' 10"  (1.473 m)   Wt 128 lb 6.4 oz (58.2 kg)   BMI 26.84 kg/m  , BMI Body mass index is 26.84 kg/m. GEN: Well nourished, well developed, female in no acute distress  HEENT: normal for age  Neck: no JVD, no carotid bruit, no masses Cardiac: RRR; soft murmur, no rubs, or gallops Respiratory:  Decreased BS bases bilaterally, normal work of  breathing GI: soft, nontender, nondistended, + BS MS: no deformity or atrophy; no edema; distal pulses are 2+ in all 4 extremities   Skin: warm and dry, no rash Neuro:  Strength and sensation are intact Psych: euthymic mood, full affect   EKG:  EKG is not ordered today  Recent Labs: 06/29/2016: ALT 14; Hemoglobin 15.9; Platelets 148; TSH 3.170 07/19/2016: BUN 18; Creatinine, Ser 0.82; Potassium 4.5; Sodium 139    Lipid Panel    Component Value Date/Time   CHOL 145 02/11/2016 1221   TRIG 99.0 02/11/2016 1221   HDL 55.60 02/11/2016 1221   CHOLHDL 3 02/11/2016 1221   VLDL 19.8 02/11/2016 1221   LDLCALC 70 02/11/2016 1221   LDLDIRECT 97.7 01/19/2010 1104     Wt Readings from Last 3 Encounters:  08/02/16 128 lb 6.4 oz (58.2 kg)  07/05/16 127 lb (57.6 kg)  06/13/16 127 lb (57.6 kg)     Other studies Reviewed: Additional studies/ records that were reviewed today include: office notes and labs.  ASSESSMENT AND PLAN:  1.  HTN: Her BP is better controlled now. She is on max dose of lisinopril and no increase in metoprolol is possible because her HR is in the 50s. I will add amlodipine 2.5 mg QHS, but ONLY if SBP is > 140 in the evening. Ck BMET today, make sure she is tolerating the lisinopril 40 mg qd. F/u with Dr Tresa Endo in 3 months.   Current medicines are reviewed at length with the patient today.  The patient does not have concerns regarding medicines.  The following changes have been made:  Add amlodipine.  Labs/ tests ordered today include:  No orders of the defined types were placed in this encounter.    Disposition:   FU with Dr Tresa Endo in October.  [name]  08/02/2016 8:56 AM    [facility] Phone: [phone]; Fax: [phone]  This note was written with the assistance of speech recognition software. Please excuse any transcriptional errors.

## 2016-08-02 NOTE — Patient Instructions (Signed)
Medication Instructions:  START AMLODIPINE 2.5MG  DAILY  If you need a refill on your cardiac medications before your next appointment, please call your pharmacy.  Labwork: BMP TODAY HERE IN OUR OFFICE AT [facility]  Follow-Up: Your physician wants you to follow-up in: AS SCHEDULED 11-02-16 @8AM  WITH DR Tresa Endo.   Thank you for choosing [facility] at [facility]!!

## 2016-08-03 ENCOUNTER — Other Ambulatory Visit: Payer: Self-pay | Admitting: Family Medicine

## 2016-08-24 ENCOUNTER — Other Ambulatory Visit: Payer: Self-pay | Admitting: Family Medicine

## 2016-08-24 DIAGNOSIS — E039 Hypothyroidism, unspecified: Secondary | ICD-10-CM

## 2016-09-18 ENCOUNTER — Other Ambulatory Visit: Payer: Self-pay | Admitting: Family Medicine

## 2016-09-18 DIAGNOSIS — I10 Essential (primary) hypertension: Secondary | ICD-10-CM

## 2016-09-18 NOTE — Telephone Encounter (Signed)
Pt needs appt first/was advised to be seen in April/thx dmf

## 2016-09-19 ENCOUNTER — Other Ambulatory Visit: Payer: Self-pay | Admitting: Family Medicine

## 2016-09-19 DIAGNOSIS — I10 Essential (primary) hypertension: Secondary | ICD-10-CM

## 2016-09-26 ENCOUNTER — Other Ambulatory Visit: Payer: Self-pay | Admitting: Family Medicine

## 2016-09-26 DIAGNOSIS — F32A Depression, unspecified: Secondary | ICD-10-CM

## 2016-09-26 DIAGNOSIS — F329 Major depressive disorder, single episode, unspecified: Secondary | ICD-10-CM

## 2016-09-26 NOTE — Telephone Encounter (Signed)
Pt needs to schedule appt first/was advised to F/U in April/thx dmf

## 2016-10-02 ENCOUNTER — Other Ambulatory Visit: Payer: Self-pay | Admitting: Family Medicine

## 2016-10-02 DIAGNOSIS — F32A Depression, unspecified: Secondary | ICD-10-CM

## 2016-10-02 DIAGNOSIS — F329 Major depressive disorder, single episode, unspecified: Secondary | ICD-10-CM

## 2016-10-03 NOTE — Telephone Encounter (Signed)
Pt was advised on 3.5.18 to f/U 1 month/thx dmf

## 2016-10-05 ENCOUNTER — Telehealth: Payer: Self-pay | Admitting: Family Medicine

## 2016-10-05 NOTE — Telephone Encounter (Signed)
Relation to SV:XBLT Call back number:[phone] Pharmacy: [facility] [address], [place] - [address] [phone] (Phone) [phone] (Fax)      Reason for call:  Patient requesting a sertraline (ZOLOFT) 50 MG tablet refill

## 2016-10-06 ENCOUNTER — Other Ambulatory Visit: Payer: Self-pay | Admitting: Family Medicine

## 2016-10-06 DIAGNOSIS — F32A Depression, unspecified: Secondary | ICD-10-CM

## 2016-10-06 DIAGNOSIS — F329 Major depressive disorder, single episode, unspecified: Secondary | ICD-10-CM

## 2016-10-06 NOTE — Telephone Encounter (Signed)
Sent in refill Patients daughter called to inquire about this refill---I did inform her it had been refilled.

## 2016-10-19 DIAGNOSIS — Z23 Encounter for immunization: Secondary | ICD-10-CM | POA: Diagnosis not present

## 2016-10-25 ENCOUNTER — Telehealth: Payer: Self-pay | Admitting: Family Medicine

## 2016-10-25 NOTE — Telephone Encounter (Signed)
Copied from CRM #[id number]. Topic: Quick Communication - See Telephone Encounter >> Oct 25, 2016 10:26 AM Lynnell Chad wrote: CRM for notification. See Telephone encounter for:  10/25/16.  Called Mrs. Mignone to schedule AWV. Lvm for pt to call office to schedule appt. Last AWV 07/27/2015. Pt can schedule at anytime. SF

## 2016-11-02 ENCOUNTER — Ambulatory Visit (INDEPENDENT_AMBULATORY_CARE_PROVIDER_SITE_OTHER): Payer: Medicare Other | Admitting: Cardiovascular Disease

## 2016-11-02 ENCOUNTER — Encounter: Payer: Self-pay | Admitting: Cardiovascular Disease

## 2016-11-02 VITALS — BP 158/65 | HR 51 | Ht <= 58 in | Wt 123.8 lb

## 2016-11-02 DIAGNOSIS — I701 Atherosclerosis of renal artery: Secondary | ICD-10-CM

## 2016-11-02 DIAGNOSIS — I700 Atherosclerosis of aorta: Secondary | ICD-10-CM | POA: Diagnosis not present

## 2016-11-02 DIAGNOSIS — E039 Hypothyroidism, unspecified: Secondary | ICD-10-CM

## 2016-11-02 DIAGNOSIS — Z79899 Other long term (current) drug therapy: Secondary | ICD-10-CM | POA: Diagnosis not present

## 2016-11-02 DIAGNOSIS — I10 Essential (primary) hypertension: Secondary | ICD-10-CM | POA: Diagnosis not present

## 2016-11-02 DIAGNOSIS — E785 Hyperlipidemia, unspecified: Secondary | ICD-10-CM

## 2016-11-02 DIAGNOSIS — K219 Gastro-esophageal reflux disease without esophagitis: Secondary | ICD-10-CM | POA: Diagnosis not present

## 2016-11-02 MED ORDER — AMLODIPINE BESYLATE 5 MG PO TABS: 5.0000 mg | ORAL_TABLET | Freq: Every day | ORAL | 3 refills | Status: AC

## 2016-11-02 NOTE — Progress Notes (Signed)
Cardiology Office Note    Date:  11/04/2016   ID:  Julie [name], DOB 1929-06-28, MRN [id number]  PCP:  Carollee Herter, Alferd Apa, DO  Cardiologist:  Shelva Majestic, MD   New cardiology evaluation, referred by Dr. Clayborn Bigness  History of Present Illness:  Julie [name] is a 83 y.o. female who has a history of hypertension, leg swelling and had been hospitalized in [place], [employer].  She was seen by Dr. Roma Schanz in follow-up of her hospitalization and was referred for cardiology evaluation.I saw her initially on 05/11/2016 and one month later.  She presents for follow-up evaluation.  Julie [name] admits to a 30 year history of hypertension.  02/27/2016.  She was admitted to [facility] for high blood pressure and lower extremity edema.  Of note, there were issues with renal insufficiency.  She had seen her primary physician.  March 03, 2016 BUN was 42, Cr1.6 and she was felt to be dehydrated.  Previously her creatinine had been 0.7.  She was referred for an echo Doppler study which he had on 03/17/2016.  This showed vigorous LV function with an EF of 65-70% and normal wall motion.  There was grade 1 diastolic dysfunction.  There was evidence for moderate tricuspid regurgitation, and she had mild-to-moderate pulmonary hypertension with estimated PA pressure at 47 mm.  Most recently, she has been on a medical regimen consisting of lisinopril 10 mg, metoprolol 100 mg twice a day for hypertension.  She has been on low-dose levothyroxine 25 g for mild hypothyroidism and has a history of GERD for which he takes Protonix.  She also has a history of gout and has been on value.   A subsequent renal profile in March 2018 showed improvement in her renal function with a creatinine of 0.94.   She denied any awareness of sleep apnea.  An Epworth Sleepiness Scale score was endorsed  at 9.  When I initially saw her, her blood pressure was labile and ranged from 170/84-148/76.  I  recommended she titrate lisinopril from 10-15 mg.  Follow-up chemistry profile showed a creatinine of 0.85 on 05/19/2016.  Her TSH was 5.29.  Her primary physician further titrated levothyroxine from 25-50 g.  A renal Doppler study on 06/08/2016 showed aortoiliac atherosclerosis without stenosis.  She had normal bilateral kidney size.  She had a normal right renal artery.  There was in the left renal artery in the 1-59% range with a maximum mid PSV of 252.  Since I last saw her, she presented to the emergency room in June 2018 with nausea and vomiting.  She was seen in the office for follow-up evaluation.  I Julie [name] on several occasions.  It was last seen on 08/02/2016.  Her blood pressure was better controlled and she is now on a regimen consisting of being 2.5 mg, HCTZ 12.5 mg, lisinopril 40 mg, metoprolol 100 mg twice a day.  She also is on omega-3 fatty acids and lovastatin 20 mg for hyperlipidemia.  She is on levothyroxine for hypothyroidism.  She takes tonics daily for GERD.  She presents for reevaluation.  Past Medical History:  Diagnosis Date  . Diabetes mellitus   . Gout   . Hyperlipidemia   . Hypertension   . Osteopenia     Past Surgical History:  Procedure Laterality Date  . CATARACT EXTRACTION    . CATARACT EXTRACTION Left 7/15   Cataract surgery done by Dr. Bing Plume.  Current Medications: Outpatient  Medications Prior to Visit  Medication Sig Dispense Refill  . allopurinol (ZYLOPRIM) 100 MG tablet TAKE 2 TABLETS BY MOUTH ONCE DAILY 180 tablet 0  . aspirin 81 MG tablet Take 81 mg by mouth daily.      . fish oil-omega-3 fatty acids 1000 MG capsule Take 2 g by mouth daily.      Marland Kitchen glucose blood (ONE TOUCH ULTRA TEST) test strip USE ONE STRIP TO CHECK GLUCOSE TWICE DAILY. Dx: E11.9 100 each 11  . hydrochlorothiazide (HYDRODIURIL) 25 MG tablet Take 0.5 tablets (12.5 mg total) by mouth daily. 30 tablet 6  . levothyroxine (SYNTHROID, LEVOTHROID) 50 MCG tablet TAKE 1 TABLET BY  MOUTH ONCE DAILY 30 tablet 2  . lisinopril (PRINIVIL,ZESTRIL) 40 MG tablet Take 1 tablet (40 mg total) by mouth daily. 30 tablet 6  . lovastatin (MEVACOR) 20 MG tablet TAKE ONE TABLET BY MOUTH AT BEDTIME (Patient taking differently: TAKE 20 MG  BY MOUTH AT BEDTIME) 90 tablet 2  . metoprolol tartrate (LOPRESSOR) 100 MG tablet TAKE 1 TABLET BY MOUTH TWICE DAILY 180 tablet 0  . ondansetron (ZOFRAN ODT) 4 MG disintegrating tablet Take 1 tablet (4 mg total) by mouth every 8 (eight) hours as needed for nausea or vomiting. 20 tablet 0  . ONETOUCH DELICA LANCETS 29J MISC USE ONE  TO CHECK GLUCOSE TWICE DAILY. Dx:E11.9 100 each 11  . pantoprazole (PROTONIX) 40 MG tablet TAKE ONE TABLET BY MOUTH ONCE DAILY 90 tablet 0  . pantoprazole (PROTONIX) 40 MG tablet TAKE ONE TABLET BY MOUTH ONCE DAILY 90 tablet 1  . sertraline (ZOLOFT) 50 MG tablet TAKE 1 TABLET BY MOUTH ONCE DAILY 30 tablet 3  . amLODipine (NORVASC) 2.5 MG tablet Take 1 tablet (2.5 mg total) by mouth daily. 30 tablet 3   No facility-administered medications prior to visit.      Allergies:   Patient has no known allergies.   Social History   Social History  . Marital status: Widowed    Spouse name: N/A  . Number of children: 2  . Years of education: N/A   Occupational History  . n/a    Social History Main Topics  . Smoking status: Never Smoker  . Smokeless tobacco: Never Used  . Alcohol use Yes     Comment: occ per pt   . Drug use: No  . Sexual activity: No   Other Topics Concern  . None   Social History Narrative   Lives by herself    Additional social history is notable that she has been widowed for 35 years.  She has 2 children, 4 grandchildren, and 2 great-grandchildren.  He lives by herself.  She completed 12th grade of education.  She never smoked.  She does drink occasional wine and vodka.   Family History:  The patient's family history includes Stroke in her other.  Her mother died at 32 with a stroke.  Her father  died at age 79 and had Parkinson's disease.  A brother died at age 87 with prostate cancer.  ROS General: Negative; No fevers, chills, or night sweats; mild fatigue HEENT: Negative; No changes in vision or hearing, sinus congestion, difficulty swallowing Pulmonary: Negative; No cough, wheezing, shortness of breath, hemoptysis Cardiovascular: See history of present illness GI: Negative; No nausea, vomiting, diarrhea, or abdominal pain GU: Negative; No dysuria, hematuria, or difficulty voiding Musculoskeletal: Negative; no myalgias, joint pain, or weakness Hematologic/Oncology: Negative; no easy bruising, bleeding Endocrine: Positive for hypothyroidism; no diabetes Neuro: Negative; no  changes in balance, headaches Skin: Negative; No rashes or skin lesions Psychiatric: Negative; No behavioral problems, depression Sleep: Negative; No snoring, daytime sleepiness, hypersomnolence, bruxism, restless legs, hypnogognic hallucinations, no cataplexy Other comprehensive 14 point system review is negative.   PHYSICAL EXAM:   VS:  BP (!) 158/65   Pulse (!) 51   Ht _0  (1.473 m)   Wt 123 lb 12.8 oz (56.2 kg)   BMI 25.87 kg/m     Repeat blood pressure by me was 144/68  Wt Readings from Last 3 Encounters:  11/02/16 123 lb 12.8 oz (56.2 kg)  08/02/16 128 lb 6.4 oz (58.2 kg)  07/05/16 127 lb (57.6 kg)    General: Alert, oriented, no distress.  Skin: normal turgor, no rashes, warm and dry HEENT: Normocephalic, atraumatic. Pupils equal round and reactive to light; sclera anicteric; extraocular muscles intact;  Nose without nasal septal hypertrophy Mouth/Parynx benign; Mallinpatti scale 3 Neck: No JVD, no carotid bruits; normal carotid upstroke Lungs: clear to ausculatation and percussion; no wheezing or rales Chest wall: without tenderness to palpitation Heart: PMI not displaced, RRR, s1 s2 normal, 1/6 systolic murmur, no diastolic murmur, no rubs, gallops, thrills, or heaves Abdomen: soft,  nontender; no hepatosplenomehaly, BS+; abdominal aorta nontender and not dilated by palpation. Back: no CVA tenderness Pulses 2+ Musculoskeletal: full range of motion, normal strength, no joint deformities Extremities: no clubbing cyanosis or edema, Homan's sign negative  Neurologic: grossly nonfocal; Cranial nerves grossly wnl Psychologic: Normal mood and affect    Studies/Labs Reviewed:   EKG:  EKG is ordered today. Sinus bradycardia 51 bpm with PAC.  Nonspecific T changes.  QS V1 and V2 and Q wave in lead 3.  June 2018 ECG (independently read by me): SB at 58; PoorR progression V1 through V3.  Borderline first degree AV block with a PR interval of 24 ms.  QTc interval 437 ms.  Small of her Q wave.  5/3/2018ECG (independently read by me): Sinus regarding 54 bpm.  Borderline left atrial enlargement.  QS complex anteroseptally.  Recent Labs: BMP Latest Ref Rng & Units 08/02/2016 07/19/2016 06/29/2016  Glucose 65 - 99 mg/dL 111(H) 133(H) 129(H)  BUN 8 - 27 mg/dL _1 Creatinine 0.57 - 1.00 mg/dL 0.88 0.82 0.68  BUN/Creat Ratio 12 - _2 -  Sodium 134 - 144 mmol/L 139 139 139  Potassium 3.5 - 5.2 mmol/L 4.0 4.5 3.6  Chloride 96 - 106 mmol/L 97 96 96(L)  CO2 20 - 29 mmol/L _3 Calcium 8.7 - 10.3 mg/dL 9.6 9.6 9.5     Hepatic Function Latest Ref Rng & Units 06/29/2016 05/19/2016 03/13/2016  Total Protein 6.5 - 8.1 g/dL 8.0 6.7 7.5  Albumin 3.5 - 5.0 g/dL 4.7 4.3 4.2  AST 15 - 41 U/L _4 ALT 14 - 54 U/L _5 Alk Phosphatase 38 - 126 U/L 109 85 75  Total Bilirubin 0.3 - 1.2 mg/dL 1.6(H) 0.8 0.4  Bilirubin, Direct 0.0 - 0.3 mg/dL - - -    CBC Latest Ref Rng & Units 06/29/2016 02/11/2016 02/01/2016  WBC 4.0 - 10.5 K/uL 6.0 4.3 6.0  Hemoglobin 12.0 - 15.0 g/dL 15.9(H) 12.2 14.5  Hematocrit 36.0 - 46.0 % 45.8 37.1 43.3  Platelets 150 - 400 K/uL 148(L) 146.0(L) 150.0   Lab Results  Component Value Date   MCV 93.5 06/29/2016   MCV 99.5 02/11/2016   MCV 95.6  02/01/2016   Lab Results  Component Value Date   TSH 3.170 06/29/2016   Lab Results  Component Value Date   HGBA1C 5.9 02/11/2016     BNP No results found for: BNP  ProBNP No results found for: PROBNP   Lipid Panel     Component Value Date/Time   CHOL 145 02/11/2016 1221   TRIG 99.0 02/11/2016 1221   HDL 55.60 02/11/2016 1221   CHOLHDL 3 02/11/2016 1221   VLDL 19.8 02/11/2016 1221   LDLCALC 70 02/11/2016 1221   LDLDIRECT 97.7 01/19/2010 1104     RADIOLOGY: No results found.   Additional studies/ records that were reviewed today include:  I reviewed the records from the bowel primary care.  I reviewed her laboratory.  The echo Doppler study was reviewed. I reviewed recent laboratory.  I also reviewed her most recent renal Doppler study as noted above.    ASSESSMENT:    1. Essential hypertension   2. Left renal artery stenosis (HCC)   3. Atherosclerosis of abdominal aorta ([place])   4. Hypothyroidism, unspecified type   5. Medication management   6. Hyperlipidemia LDL goal <70   7. Gastroesophageal reflux disease without esophagitis     PLAN:  Julie [name] is an 83 year old female who has a >30 year history of hypertension and recent significant blood pressure lability.  She was  hospitalized with shortness of breath and leg swelling.  On her echo Doppler study she was found to have  hyperdynamic LV function and grade 1 diastolic dysfunction.  There was evidence for moderate tricuspid regurgitation with moderate pulmonary hypertension.  Over several office visits, her medications have been titrated and her blood pressure today continues to be elevated at 144/68.  When rechecked by me but was 158/65.  Upon presentation.  I have recommended further titration of amlodipine from 2.5 mg up to 5 mg daily.  She will continue her present dose of metoprolol 100 mg twice a and HCTZ 12.5 mg.  There is no edema.  Her renal study showed her to iliac atherosclerosis without stenosis,  and mild left renal artery stenosis in the 1-59% range.  She continues to be on lovastatin and omega-3 fatty acid for hyperlipidemia , and LDL was 70 when last checked.  She is now on increase levothyroxine and feels well and appears euthyroid.  TSH had normalized with the increased regimen.  Her GERD is controlled with pantoprazole.  I will see her in 4 months for reevaluation.  Medication Adjustments/Labs and Tests Ordered: Current medicines are reviewed at length with the patient today.  Concerns regarding medicines are outlined above.  Medication changes, Labs and Tests ordered today are listed in the Patient Instructions below. Patient Instructions  Medication Instructions:  INCREASE amlodipine (Norvasc) to 5 mg daily-new presciption has been sent to pharmacy.  Follow-Up: Your physician recommends that you schedule a follow-up appointment in: 4 MONTHS with Dr. Claiborne Billings.    Any Other Special Instructions Will Be Listed Below (If Applicable).     If you need a refill on your cardiac medications before your next appointment, please call your pharmacy.      Signed, Shelva Majestic, MD  11/04/2016 2:17 PM    [facility] [address], [place], [place], [place]  [address] Phone: [phone]

## 2016-11-02 NOTE — Patient Instructions (Signed)
Medication Instructions:  INCREASE amlodipine (Norvasc) to 5 mg daily-new presciption has been sent to pharmacy.  Follow-Up: Your physician recommends that you schedule a follow-up appointment in: 4 MONTHS with Dr. Tresa Endo.    Any Other Special Instructions Will Be Listed Below (If Applicable).     If you need a refill on your cardiac medications before your next appointment, please call your pharmacy.

## 2016-11-06 ENCOUNTER — Ambulatory Visit (INDEPENDENT_AMBULATORY_CARE_PROVIDER_SITE_OTHER): Payer: Medicare Other | Admitting: Family Medicine

## 2016-11-06 ENCOUNTER — Encounter: Payer: Self-pay | Admitting: Family Medicine

## 2016-11-06 DIAGNOSIS — I701 Atherosclerosis of renal artery: Secondary | ICD-10-CM

## 2016-11-06 DIAGNOSIS — E039 Hypothyroidism, unspecified: Secondary | ICD-10-CM

## 2016-11-06 DIAGNOSIS — F329 Major depressive disorder, single episode, unspecified: Secondary | ICD-10-CM | POA: Diagnosis not present

## 2016-11-06 DIAGNOSIS — F32A Depression, unspecified: Secondary | ICD-10-CM

## 2016-11-06 MED ORDER — SERTRALINE HCL 50 MG PO TABS: 50.0000 mg | ORAL_TABLET | Freq: Every day | ORAL | 3 refills | Status: DC

## 2016-11-06 MED ORDER — LEVOTHYROXINE SODIUM 50 MCG PO TABS: 50.0000 ug | ORAL_TABLET | Freq: Every day | ORAL | 3 refills | Status: AC

## 2016-11-06 NOTE — Progress Notes (Signed)
Patient ID: Julie [name], female   DOB: 1929/08/19, 83 y.o.   MRN: [id number]     Subjective:  I acted as a Neurosurgeon for Dr. Zola Button.  Julie [name], CMA   Patient ID: Julie [name], female    DOB: [birth year], 83 y.o.   MRN: [id number]  Chief Complaint  Patient presents with  . Depression    HPI  Patient is in today to follow up on depression and to get Zoloft refilled.  She has been doing well on current treatment.  Patient Care Team: Zola Button, Grayling Congress, DO as PCP - General   Past Medical History:  Diagnosis Date  . Diabetes mellitus   . Gout   . Hyperlipidemia   . Hypertension   . Osteopenia     Past Surgical History:  Procedure Laterality Date  . CATARACT EXTRACTION    . CATARACT EXTRACTION Left 7/15    Family History  Problem Relation Age of Onset  . Stroke Other     Social History   Social History  . Marital status: Widowed    Spouse name: N/A  . Number of children: 2  . Years of education: N/A   Occupational History  . n/a    Social History Main Topics  . Smoking status: Never Smoker  . Smokeless tobacco: Never Used  . Alcohol use Yes     Comment: occ per pt   . Drug use: No  . Sexual activity: No   Other Topics Concern  . Not on file   Social History Narrative   Lives by herself    Outpatient Medications Prior to Visit  Medication Sig Dispense Refill  . allopurinol (ZYLOPRIM) 100 MG tablet TAKE 2 TABLETS BY MOUTH ONCE DAILY 180 tablet 0  . amLODipine (NORVASC) 5 MG tablet Take 1 tablet (5 mg total) by mouth daily. 90 tablet 3  . aspirin 81 MG tablet Take 81 mg by mouth daily.      . fish oil-omega-3 fatty acids 1000 MG capsule Take 2 g by mouth daily.      Marland Kitchen glucose blood (ONE TOUCH ULTRA TEST) test strip USE ONE STRIP TO CHECK GLUCOSE TWICE DAILY. Dx: E11.9 100 each 11  . lisinopril (PRINIVIL,ZESTRIL) 40 MG tablet Take 1 tablet (40 mg total) by mouth daily. 30 tablet 6  . lovastatin (MEVACOR) 20 MG tablet TAKE ONE TABLET BY MOUTH AT  BEDTIME (Patient taking differently: TAKE 20 MG  BY MOUTH AT BEDTIME) 90 tablet 2  . metoprolol tartrate (LOPRESSOR) 100 MG tablet TAKE 1 TABLET BY MOUTH TWICE DAILY 180 tablet 0  . ondansetron (ZOFRAN ODT) 4 MG disintegrating tablet Take 1 tablet (4 mg total) by mouth every 8 (eight) hours as needed for nausea or vomiting. 20 tablet 0  . ONETOUCH DELICA LANCETS 33G MISC USE ONE  TO CHECK GLUCOSE TWICE DAILY. Dx:E11.9 100 each 11  . pantoprazole (PROTONIX) 40 MG tablet TAKE ONE TABLET BY MOUTH ONCE DAILY 90 tablet 0  . pantoprazole (PROTONIX) 40 MG tablet TAKE ONE TABLET BY MOUTH ONCE DAILY 90 tablet 1  . levothyroxine (SYNTHROID, LEVOTHROID) 50 MCG tablet TAKE 1 TABLET BY MOUTH ONCE DAILY 30 tablet 2  . sertraline (ZOLOFT) 50 MG tablet TAKE 1 TABLET BY MOUTH ONCE DAILY 30 tablet 3  . hydrochlorothiazide (HYDRODIURIL) 25 MG tablet Take 0.5 tablets (12.5 mg total) by mouth daily. 30 tablet 6   No facility-administered medications prior to visit.     No Known Allergies  Review of  Systems  Constitutional: Negative for fever and malaise/fatigue.  HENT: Negative for congestion.   Eyes: Negative for blurred vision.  Respiratory: Negative for cough and shortness of breath.   Cardiovascular: Negative for chest pain, palpitations and leg swelling.  Gastrointestinal: Negative for vomiting.  Musculoskeletal: Negative for back pain.  Skin: Negative for rash.  Neurological: Negative for loss of consciousness.       Objective:    Physical Exam  Constitutional: She is oriented to person, place, and time. She appears well-developed and well-nourished.  HENT:  Head: Normocephalic and atraumatic.  Eyes: Conjunctivae and EOM are normal.  Neck: Normal range of motion. Neck supple. No JVD present. Carotid bruit is not present. No thyromegaly present.  Cardiovascular: Normal rate, regular rhythm and normal heart sounds.   No murmur heard. Pulmonary/Chest: Effort normal and breath sounds normal. No  respiratory distress. She has no wheezes. She has no rales. She exhibits no tenderness.  Musculoskeletal: She exhibits no edema.  Neurological: She is alert and oriented to person, place, and time.  Psychiatric: She has a normal mood and affect.  Nursing note and vitals reviewed.   BP (!) 148/50   Pulse (!) 48   Temp 97.9 F (36.6 C) (Oral)   Resp 16   Ht 4\' 10"  (1.473 m)   Wt 124 lb 12.8 oz (56.6 kg)   SpO2 95%   BMI 26.08 kg/m  Wt Readings from Last 3 Encounters:  11/06/16 124 lb 12.8 oz (56.6 kg)  11/02/16 123 lb 12.8 oz (56.2 kg)  08/02/16 128 lb 6.4 oz (58.2 kg)   BP Readings from Last 3 Encounters:  11/06/16 (!) 148/50  11/02/16 (!) 158/65  08/02/16 140/82     Immunization History  Administered Date(s) Administered  . Influenza Whole 11/30/2006, 09/30/2007, 10/14/2008, 10/07/2009, 10/03/2010, 10/12/2011  . Influenza, High Dose Seasonal PF 09/30/2015  . Influenza-Unspecified 10/09/2012, 10/03/2013, 10/10/2014  . Pneumococcal Conjugate-13 07/27/2015  . Pneumococcal Polysaccharide-23 10/28/2008    Health Maintenance  Topic Date Due  . TETANUS/TDAP  01/08/1949  . FOOT EXAM  11/05/2012  . OPHTHALMOLOGY EXAM  08/07/2014  . MAMMOGRAM  08/28/2014  . INFLUENZA VACCINE  08/09/2016  . HEMOGLOBIN A1C  08/27/2016  . PNA vac Low Risk Adult  Completed    Lab Results  Component Value Date   WBC 6.0 06/29/2016   HGB 15.9 (H) 06/29/2016   HCT 45.8 06/29/2016   PLT 148 (L) 06/29/2016   GLUCOSE 111 (H) 08/02/2016   CHOL 145 02/11/2016   TRIG 99.0 02/11/2016   HDL 55.60 02/11/2016   LDLDIRECT 97.7 01/19/2010   LDLCALC 70 02/11/2016   ALT 14 06/29/2016   AST 29 06/29/2016   NA 139 08/02/2016   K 4.0 08/02/2016   CL 97 08/02/2016   CREATININE 0.88 08/02/2016   BUN 16 08/02/2016   CO2 25 08/02/2016   TSH 3.170 06/29/2016   HGBA1C 5.9 02/11/2016   MICROALBUR 3.8 (H) 05/08/2014    Lab Results  Component Value Date   TSH 3.170 06/29/2016   Lab Results    Component Value Date   WBC 6.0 06/29/2016   HGB 15.9 (H) 06/29/2016   HCT 45.8 06/29/2016   MCV 93.5 06/29/2016   PLT 148 (L) 06/29/2016   Lab Results  Component Value Date   NA 139 08/02/2016   K 4.0 08/02/2016   CO2 25 08/02/2016   GLUCOSE 111 (H) 08/02/2016   BUN 16 08/02/2016   CREATININE 0.88 08/02/2016   BILITOT 1.6 (H)  06/29/2016   ALKPHOS 109 06/29/2016   AST 29 06/29/2016   ALT 14 06/29/2016   PROT 8.0 06/29/2016   ALBUMIN 4.7 06/29/2016   CALCIUM 9.6 08/02/2016   ANIONGAP 19 (H) 06/29/2016   GFR 60.27 03/13/2016   Lab Results  Component Value Date   CHOL 145 02/11/2016   Lab Results  Component Value Date   HDL 55.60 02/11/2016   Lab Results  Component Value Date   LDLCALC 70 02/11/2016   Lab Results  Component Value Date   TRIG 99.0 02/11/2016   Lab Results  Component Value Date   CHOLHDL 3 02/11/2016   Lab Results  Component Value Date   HGBA1C 5.9 02/11/2016         Assessment & Plan:   Problem List Items Addressed This Visit      Unprioritized   Depression    con't zololft Symptoms improved on meds      Relevant Medications   sertraline (ZOLOFT) 50 MG tablet   Hypothyroidism    Lab Results  Component Value Date   TSH 3.170 06/29/2016   con't synthroid      Relevant Medications   levothyroxine (SYNTHROID, LEVOTHROID) 50 MCG tablet      I have changed Julie [name]'s levothyroxine and sertraline. I am also having her maintain her aspirin, fish oil-omega-3 fatty acids, ONETOUCH DELICA LANCETS 33G, glucose blood, lovastatin, ondansetron, hydrochlorothiazide, pantoprazole, allopurinol, lisinopril, pantoprazole, metoprolol tartrate, and amLODipine.  Meds ordered this encounter  Medications  . levothyroxine (SYNTHROID, LEVOTHROID) 50 MCG tablet    Sig: Take 1 tablet (50 mcg total) by mouth daily.    Dispense:  90 tablet    Refill:  3    Please consider 90 day supplies to promote better adherence  . sertraline (ZOLOFT) 50 MG  tablet    Sig: Take 1 tablet (50 mg total) by mouth daily.    Dispense:  90 tablet    Refill:  3    Please consider 90 day supplies to promote better adherence    CMA served as scribe during this visit. History, Physical and Plan performed by medical provider. Documentation and orders reviewed and attested to.  Donato SchultzYvonne R Lowne Chase, DO

## 2016-11-06 NOTE — Assessment & Plan Note (Signed)
con't zololft Symptoms improved on meds

## 2016-11-06 NOTE — Assessment & Plan Note (Addendum)
Lab Results  Component Value Date   TSH 3.170 06/29/2016   con't synthroid

## 2016-11-06 NOTE — Patient Instructions (Signed)

## 2016-12-15 ENCOUNTER — Other Ambulatory Visit: Payer: Self-pay | Admitting: Family Medicine

## 2016-12-15 DIAGNOSIS — M109 Gout, unspecified: Secondary | ICD-10-CM

## 2016-12-15 DIAGNOSIS — I10 Essential (primary) hypertension: Secondary | ICD-10-CM

## 2017-01-16 ENCOUNTER — Other Ambulatory Visit: Payer: Self-pay | Admitting: Family Medicine

## 2017-01-16 DIAGNOSIS — E785 Hyperlipidemia, unspecified: Secondary | ICD-10-CM

## 2017-02-08 ENCOUNTER — Encounter (INDEPENDENT_AMBULATORY_CARE_PROVIDER_SITE_OTHER): Payer: Self-pay

## 2017-02-08 ENCOUNTER — Ambulatory Visit (INDEPENDENT_AMBULATORY_CARE_PROVIDER_SITE_OTHER): Payer: Medicare Other | Admitting: Cardiovascular Disease

## 2017-02-08 VITALS — BP 132/56 | HR 49 | Ht <= 58 in | Wt 125.0 lb

## 2017-02-08 DIAGNOSIS — I10 Essential (primary) hypertension: Secondary | ICD-10-CM

## 2017-02-08 DIAGNOSIS — I701 Atherosclerosis of renal artery: Secondary | ICD-10-CM

## 2017-02-08 DIAGNOSIS — Z79899 Other long term (current) drug therapy: Secondary | ICD-10-CM

## 2017-02-08 DIAGNOSIS — E039 Hypothyroidism, unspecified: Secondary | ICD-10-CM | POA: Diagnosis not present

## 2017-02-08 DIAGNOSIS — I700 Atherosclerosis of aorta: Secondary | ICD-10-CM | POA: Diagnosis not present

## 2017-02-08 DIAGNOSIS — E785 Hyperlipidemia, unspecified: Secondary | ICD-10-CM | POA: Diagnosis not present

## 2017-02-08 DIAGNOSIS — K219 Gastro-esophageal reflux disease without esophagitis: Secondary | ICD-10-CM | POA: Diagnosis not present

## 2017-02-08 MED ORDER — METOPROLOL TARTRATE 100 MG PO TABS: ORAL_TABLET | ORAL | 3 refills | Status: DC

## 2017-02-08 NOTE — Patient Instructions (Signed)
Medication Instructions:  DECREASE metoprolol tartrate (Lopressor) to 100 mg (1 tablet) in the AM and 50 mg (1/2 tablet) in the PM  Follow-Up: Your physician wants you to follow-up in: 6 months with Dr. Tresa Endo. You will receive a reminder letter in the mail two months in advance. If you don't receive a letter, please call our office to schedule the follow-up appointment.   Any Other Special Instructions Will Be Listed Below (If Applicable).     If you need a refill on your cardiac medications before your next appointment, please call your pharmacy.

## 2017-02-08 NOTE — Progress Notes (Signed)
Cardiology Office Note    Date:  02/10/2017   ID:  Julie, [name] 02-14-1929, MRN [id number]  PCP:  Carollee Herter, Alferd Apa, DO  Cardiologist:  Shelva Majestic, MD   F/U cardiology evaluation .  Initially referred by Dr. Clayborn Bigness  History of Present Illness:  Julie [name] is a 84 y.o. female who has a history of hypertension, leg swelling and had been hospitalized in [place], [place].  She was seen by Dr. Roma Schanz in follow-up of her hospitalization and was referred for cardiology evaluation.I saw her initially on 05/11/2016 and for subsequent evaluations.  She presents for three-month follow-up evaluation.  Julie [name] admits to a 30 year history of hypertension.  02/27/2016.  She was admitted to [facility] for high blood pressure and lower extremity edema.  Of note, there were issues with renal insufficiency.  She had seen her primary physician.  March 03, 2016 BUN was 42, Cr1.6 and she was felt to be dehydrated.  Previously her creatinine had been 0.7.  She was referred for an echo Doppler study which he had on 03/17/2016.  This showed vigorous LV function with an EF of 65-70% and normal wall motion.  There was grade 1 diastolic dysfunction.  There was evidence for moderate tricuspid regurgitation, and she had mild-to-moderate pulmonary hypertension with estimated PA pressure at 47 mm.  Most recently, she has been on a medical regimen consisting of lisinopril 10 mg, metoprolol 100 mg twice a day for hypertension.  She has been on low-dose levothyroxine 25 g for mild hypothyroidism and has a history of GERD for which he takes Protonix.  She also has a history of gout and has been on value.   A subsequent renal profile in March 2018 showed improvement in her renal function with a creatinine of 0.94.   She denied any awareness of sleep apnea.  An Epworth Sleepiness Scale score was endorsed  at 9.  When I initially saw her, her blood pressure was labile  and ranged from 170/84-148/76.  I recommended she titrate lisinopril from 10-15 mg.  Follow-up chemistry profile showed a creatinine of 0.85 on 05/19/2016.  Her TSH was 5.29.  Her primary physician further titrated levothyroxine from 25-50 g.  A renal Doppler study on 06/08/2016 showed aortoiliac atherosclerosis without stenosis.  She had normal bilateral kidney size.  She had a normal right renal artery.  There was in the left renal artery in the 1-59% range with a maximum mid PSV of 252.  She presented to the emergency room in June 2018 with nausea and vomiting.  She was seen in the office for follow-up evaluation and saw Rosaria Ferries on several occasions.  When I last saw her in October 2018 .  Her blood pressure was still elevated.  I recommended further titration of amlodipine from 2.5 mg up to 5 mg daily and recommended she continue her metoprolol 100 mg twice a day and HCTZ 12.5 mg.  Her renal Doppler study showed iliac atherosclerosis without stenosis and mild left renal artery stenosis in the 1-59% range.  She has felt well.  She lives by herself with family members close by.  She will be going to [facility] for the month of February.  She denies palpitations.  She denies presyncope or syncope.  She presents for reevaluation.  Past Medical History:  Diagnosis Date  . Diabetes mellitus   . Gout   . Hyperlipidemia   . Hypertension   .  Osteopenia     Past Surgical History:  Procedure Laterality Date  . CATARACT EXTRACTION    . CATARACT EXTRACTION Left 7/15   Cataract surgery done by Dr. Bing Plume.  Current Medications: Outpatient Medications Prior to Visit  Medication Sig Dispense Refill  . allopurinol (ZYLOPRIM) 100 MG tablet TAKE 2 TABLETS BY MOUTH ONCE DAILY 180 tablet 0  . aspirin 81 MG tablet Take 81 mg by mouth daily.      . fish oil-omega-3 fatty acids 1000 MG capsule Take 2 g by mouth daily.      Marland Kitchen glucose blood (ONE TOUCH ULTRA TEST) test strip USE ONE STRIP TO CHECK GLUCOSE  TWICE DAILY. Dx: E11.9 100 each 11  . levothyroxine (SYNTHROID, LEVOTHROID) 50 MCG tablet Take 1 tablet (50 mcg total) by mouth daily. 90 tablet 3  . lisinopril (PRINIVIL,ZESTRIL) 40 MG tablet Take 1 tablet (40 mg total) by mouth daily. 30 tablet 6  . lovastatin (MEVACOR) 20 MG tablet TAKE 1 TABLET BY MOUTH AT BEDTIME 90 tablet 2  . ondansetron (ZOFRAN ODT) 4 MG disintegrating tablet Take 1 tablet (4 mg total) by mouth every 8 (eight) hours as needed for nausea or vomiting. 20 tablet 0  . ONETOUCH DELICA LANCETS 67R MISC USE ONE  TO CHECK GLUCOSE TWICE DAILY. Dx:E11.9 100 each 11  . pantoprazole (PROTONIX) 40 MG tablet TAKE ONE TABLET BY MOUTH ONCE DAILY 90 tablet 0  . pantoprazole (PROTONIX) 40 MG tablet TAKE ONE TABLET BY MOUTH ONCE DAILY 90 tablet 1  . sertraline (ZOLOFT) 50 MG tablet Take 1 tablet (50 mg total) by mouth daily. 90 tablet 3  . metoprolol tartrate (LOPRESSOR) 100 MG tablet TAKE 1 TABLET BY MOUTH TWICE DAILY 180 tablet 0  . amLODipine (NORVASC) 5 MG tablet Take 1 tablet (5 mg total) by mouth daily. 90 tablet 3  . hydrochlorothiazide (HYDRODIURIL) 25 MG tablet Take 0.5 tablets (12.5 mg total) by mouth daily. 30 tablet 6   No facility-administered medications prior to visit.      Allergies:   Patient has no known allergies.   Social History   Socioeconomic History  . Marital status: Widowed    Spouse name: Not on file  . Number of children: 2  . Years of education: Not on file  . Highest education level: Not on file  Social Needs  . Financial resource strain: Not on file  . Food insecurity - worry: Not on file  . Food insecurity - inability: Not on file  . Transportation needs - medical: Not on file  . Transportation needs - non-medical: Not on file  Occupational History  . Occupation: n/a  Tobacco Use  . Smoking status: Never Smoker  . Smokeless tobacco: Never Used  Substance and Sexual Activity  . Alcohol use: Yes    Comment: occ per pt   . Drug use: No  .  Sexual activity: No  Other Topics Concern  . Not on file  Social History Narrative   Lives by herself    Additional social history is notable that she has been widowed for 35 years.  She has 2 children, 4 grandchildren, and 2 great-grandchildren.  He lives by herself.  She completed 12th grade of education.  She never smoked.  She does drink occasional wine and vodka.   Family History:  The patient's family history includes Stroke in her other.  Her mother died at 85 with a stroke.  Her father died at age 14 and had Parkinson's disease.  A brother died at age 75 with prostate cancer.  ROS General: Negative; No fevers, chills, or night sweats; mild fatigue HEENT: Negative; No changes in vision or hearing, sinus congestion, difficulty swallowing Pulmonary: Negative; No cough, wheezing, shortness of breath, hemoptysis Cardiovascular: See history of present illness GI: Negative; No nausea, vomiting, diarrhea, or abdominal pain GU: Negative; No dysuria, hematuria, or difficulty voiding Musculoskeletal: Negative; no myalgias, joint pain, or weakness Hematologic/Oncology: Negative; no easy bruising, bleeding Endocrine: Positive for hypothyroidism; no diabetes Neuro: Negative; no changes in balance, headaches Skin: Negative; No rashes or skin lesions Psychiatric: Negative; No behavioral problems, depression Sleep: Negative; No snoring, daytime sleepiness, hypersomnolence, bruxism, restless legs, hypnogognic hallucinations, no cataplexy Other comprehensive 14 point system review is negative.   PHYSICAL EXAM:   VS:  BP (!) 132/56   Pulse (!) 49   Ht _0  (1.473 m)   Wt 125 lb (56.7 kg)   BMI 26.13 kg/m     Repeat blood pressure by me was 132/60 supine and 122/64 standing.  Wt Readings from Last 3 Encounters:  02/08/17 125 lb (56.7 kg)  11/06/16 124 lb 12.8 oz (56.6 kg)  11/02/16 123 lb 12.8 oz (56.2 kg)    General: Alert, oriented, no distress.  Skin: normal turgor, no rashes, warm  and dry HEENT: Normocephalic, atraumatic. Pupils equal round and reactive to light; sclera anicteric; extraocular muscles intact;  Nose without nasal septal hypertrophy Mouth/Parynx benign; Mallinpatti scale 3 Neck: No JVD, no carotid bruits; normal carotid upstroke Lungs: clear to ausculatation and percussion; no wheezing or rales Chest wall: without tenderness to palpitation Heart: PMI not displaced, RRR, s1 s2 normal, 1/6 systolic murmur, no diastolic murmur, no rubs, gallops, thrills, or heaves Abdomen: soft, nontender; no hepatosplenomehaly, BS+; abdominal aorta nontender and not dilated by palpation. Back: no CVA tenderness Pulses 2+ Musculoskeletal: full range of motion, normal strength, no joint deformities Extremities: no clubbing cyanosis or edema, Homan's sign negative  Neurologic: grossly nonfocal; Cranial nerves grossly wnl Psychologic: Normal mood and affect    Studies/Labs Reviewed:   ECG (independently read by me): sinus bradycardia at 49 bpm.  First-degree AV block with a PR interval at 228 ms.  Poor R wave progression.  October 2018 EKG:  EKG is ordered today. Sinus bradycardia 51 bpm with PAC.  Nonspecific T changes.  QS V1 and V2 and Q wave in lead 3.  June 2018 ECG (independently read by me): SB at 58; PoorR progression V1 through V3.  Borderline first degree AV block with a PR interval of 24 ms.  QTc interval 437 ms.  Small of her Q wave.  5/3/2018ECG (independently read by me): Sinus regarding 54 bpm.  Borderline left atrial enlargement.  QS complex anteroseptally.  Recent Labs: BMP Latest Ref Rng & Units 08/02/2016 07/19/2016 06/29/2016  Glucose 65 - 99 mg/dL 111(H) 133(H) 129(H)  BUN 8 - 27 mg/dL _1 Creatinine 0.57 - 1.00 mg/dL 0.88 0.82 0.68  BUN/Creat Ratio 12 - _2 -  Sodium 134 - 144 mmol/L 139 139 139  Potassium 3.5 - 5.2 mmol/L 4.0 4.5 3.6  Chloride 96 - 106 mmol/L 97 96 96(L)  CO2 20 - 29 mmol/L _3 Calcium 8.7 - 10.3 mg/dL 9.6 9.6  9.5     Hepatic Function Latest Ref Rng & Units 06/29/2016 05/19/2016 03/13/2016  Total Protein 6.5 - 8.1 g/dL 8.0 6.7 7.5  Albumin 3.5 - 5.0 g/dL 4.7 4.3 4.2  AST 15 -  41 U/L _0 ALT 14 - 54 U/L _1 Alk Phosphatase 38 - 126 U/L 109 85 75  Total Bilirubin 0.3 - 1.2 mg/dL 1.6(H) 0.8 0.4  Bilirubin, Direct 0.0 - 0.3 mg/dL - - -    CBC Latest Ref Rng & Units 06/29/2016 02/11/2016 02/01/2016  WBC 4.0 - 10.5 K/uL 6.0 4.3 6.0  Hemoglobin 12.0 - 15.0 g/dL 15.9(H) 12.2 14.5  Hematocrit 36.0 - 46.0 % 45.8 37.1 43.3  Platelets 150 - 400 K/uL 148(L) 146.0(L) 150.0   Lab Results  Component Value Date   MCV 93.5 06/29/2016   MCV 99.5 02/11/2016   MCV 95.6 02/01/2016   Lab Results  Component Value Date   TSH 3.170 06/29/2016   Lab Results  Component Value Date   HGBA1C 5.9 02/11/2016     BNP No results found for: BNP  ProBNP No results found for: PROBNP   Lipid Panel     Component Value Date/Time   CHOL 145 02/11/2016 1221   TRIG 99.0 02/11/2016 1221   HDL 55.60 02/11/2016 1221   CHOLHDL 3 02/11/2016 1221   VLDL 19.8 02/11/2016 1221   LDLCALC 70 02/11/2016 1221   LDLDIRECT 97.7 01/19/2010 1104     RADIOLOGY: No results found.   Additional studies/ records that were reviewed today include:  I reviewed the records from the bowel primary care.  I reviewed her laboratory.  The echo Doppler study was reviewed. I reviewed recent laboratory.  I also reviewed her  renal Doppler study as noted above.    ASSESSMENT:    1. Essential hypertension   2. Left renal artery stenosis (HCC)   3. Atherosclerosis of abdominal aorta ([place])   4. Hyperlipidemia LDL goal <70   5. Gastroesophageal reflux disease without esophagitis   6. Hypothyroidism, unspecified type   7. Medication management     PLAN:  Ms. Hufstedler is an 84 year old female who has a >30 year history of hypertension and recent significant blood pressure lability.  She was  hospitalized with shortness of breath  and leg swelling.  On her echo Doppler study she was found to have  hyperdynamic LV function and grade 1 diastolic dysfunction.  There was evidence for moderate tricuspid regurgitation with moderate pulmonary hypertension.  Over several office visits .  Her medications have been titrated for improved blood pressure response.  Today, her blood pressures actually stable.  She is bradycardic with a pulse of 49.  Although she is asymptomatic with her age of 64 years and with her plan to be in [employer] for the next month.  I am reducing metoprolol from 100 mg twice a day to 100 mg in the morning and 50 mg in the evening.  She will monitor her heart rate and blood pressure.  She continue her current dose of amlodipine 5 mg and lisinopril 40 mg daily.  She is on lovastatin hyperlipidemia.  She is tolerating this well without myalgias.  LDL was 70 when last checked.  She has a history of hypothyroidism and is tolerating levothyroxine at 50 g.  She has GERD which is controlled with pantoprazole.  I will see her in 6 months for reevaluation.  Medication Adjustments/Labs and Tests Ordered: Current medicines are reviewed at length with the patient today.  Concerns regarding medicines are outlined above.  Medication changes, Labs and Tests ordered today are listed in the Patient Instructions below. Patient Instructions  Medication Instructions:  DECREASE metoprolol tartrate (Lopressor) to 100 mg (  1 tablet) in the AM and 50 mg (1/2 tablet) in the PM  Follow-Up: Your physician wants you to follow-up in: 6 months with Dr. Claiborne Billings. You will receive a reminder letter in the mail two months in advance. If you don't receive a letter, please call our office to schedule the follow-up appointment.   Any Other Special Instructions Will Be Listed Below (If Applicable).     If you need a refill on your cardiac medications before your next appointment, please call your pharmacy.      Signed, Shelva Majestic, MD    02/10/2017 5:55 [facility] [address] [place], [place], [place]  [address] Phone: [phone]

## 2017-02-10 ENCOUNTER — Encounter: Payer: Self-pay | Admitting: Cardiovascular Disease

## 2017-02-19 ENCOUNTER — Other Ambulatory Visit: Payer: Self-pay

## 2017-02-19 DIAGNOSIS — I10 Essential (primary) hypertension: Secondary | ICD-10-CM

## 2017-02-19 MED ORDER — LISINOPRIL 40 MG PO TABS: 40.0000 mg | ORAL_TABLET | Freq: Every day | ORAL | 3 refills | Status: AC

## 2017-03-18 ENCOUNTER — Other Ambulatory Visit: Payer: Self-pay | Admitting: Family Medicine

## 2017-03-18 DIAGNOSIS — M109 Gout, unspecified: Secondary | ICD-10-CM

## 2017-03-18 DIAGNOSIS — I10 Essential (primary) hypertension: Secondary | ICD-10-CM

## 2017-05-01 ENCOUNTER — Encounter: Payer: Self-pay | Admitting: Family Medicine

## 2017-05-01 ENCOUNTER — Ambulatory Visit (INDEPENDENT_AMBULATORY_CARE_PROVIDER_SITE_OTHER): Payer: Medicare Other | Admitting: Family Medicine

## 2017-05-01 VITALS — BP 132/60 | HR 55 | Temp 98.6°F | Resp 16 | Wt 121.8 lb

## 2017-05-01 DIAGNOSIS — I701 Atherosclerosis of renal artery: Secondary | ICD-10-CM | POA: Diagnosis not present

## 2017-05-01 DIAGNOSIS — I10 Essential (primary) hypertension: Secondary | ICD-10-CM

## 2017-05-01 DIAGNOSIS — E039 Hypothyroidism, unspecified: Secondary | ICD-10-CM | POA: Diagnosis not present

## 2017-05-01 DIAGNOSIS — K219 Gastro-esophageal reflux disease without esophagitis: Secondary | ICD-10-CM | POA: Diagnosis not present

## 2017-05-01 DIAGNOSIS — F321 Major depressive disorder, single episode, moderate: Secondary | ICD-10-CM

## 2017-05-01 DIAGNOSIS — M109 Gout, unspecified: Secondary | ICD-10-CM

## 2017-05-01 DIAGNOSIS — F1010 Alcohol abuse, uncomplicated: Secondary | ICD-10-CM

## 2017-05-01 DIAGNOSIS — E119 Type 2 diabetes mellitus without complications: Secondary | ICD-10-CM | POA: Diagnosis not present

## 2017-05-01 DIAGNOSIS — R059 Cough, unspecified: Secondary | ICD-10-CM

## 2017-05-01 DIAGNOSIS — R05 Cough: Secondary | ICD-10-CM | POA: Diagnosis not present

## 2017-05-01 MED ORDER — PANTOPRAZOLE SODIUM 40 MG PO TBEC: 40.0000 mg | DELAYED_RELEASE_TABLET | Freq: Every day | ORAL | 1 refills | Status: DC

## 2017-05-01 MED ORDER — ALLOPURINOL 100 MG PO TABS: 200.0000 mg | ORAL_TABLET | Freq: Every day | ORAL | 0 refills | Status: DC

## 2017-05-01 NOTE — Assessment & Plan Note (Signed)
Xray Antihistamine otc

## 2017-05-01 NOTE — Progress Notes (Signed)
Subjective:  I acted as a Neurosurgeon for [employer]. Fuller Song, RMA   Patient ID: Julie [name], female    DOB: 1929-04-01, 84 y.o.   MRN: [id number]  Chief Complaint  Patient presents with  . Alcohol Intoxication    HPI  Patient is in today for concerns alcoholism   She has been falling.  Her daughter states all she drinks  Vodka all day long and nothing else.  Pt came in c/o of flu symptoms--- but she has no congestion, no fever. No n/v.  Her daughter showed cma  Pictures of garbage full of vodka bottles.  She has had no alcohol since yesterday.  Pt has not admitted she is drinking a lot Patient Care Team: Zola Button, Grayling Congress, DO as PCP - General   Past Medical History:  Diagnosis Date  . Diabetes mellitus   . Gout   . Hyperlipidemia   . Hypertension   . Osteopenia     Past Surgical History:  Procedure Laterality Date  . CATARACT EXTRACTION    . CATARACT EXTRACTION Left 7/15    Family History  Problem Relation Age of Onset  . Stroke Other     Social History   Socioeconomic History  . Marital status: Widowed    Spouse name: Not on file  . Number of children: 2  . Years of education: Not on file  . Highest education level: Not on file  Occupational History  . Occupation: n/a  Social Needs  . Financial resource strain: Not on file  . Food insecurity:    Worry: Not on file    Inability: Not on file  . Transportation needs:    Medical: Not on file    Non-medical: Not on file  Tobacco Use  . Smoking status: Never Smoker  . Smokeless tobacco: Never Used  Substance and Sexual Activity  . Alcohol use: Yes    Comment: occ per pt   . Drug use: No  . Sexual activity: Never  Lifestyle  . Physical activity:    Days per week: Not on file    Minutes per session: Not on file  . Stress: Not on file  Relationships  . Social connections:    Talks on phone: Not on file    Gets together: Not on file    Attends religious service: Not on file    Active member of  club or organization: Not on file    Attends meetings of clubs or organizations: Not on file    Relationship status: Not on file  . Intimate partner violence:    Fear of current or ex partner: Not on file    Emotionally abused: Not on file    Physically abused: Not on file    Forced sexual activity: Not on file  Other Topics Concern  . Not on file  Social History Narrative   Lives by herself    Outpatient Medications Prior to Visit  Medication Sig Dispense Refill  . aspirin 81 MG tablet Take 81 mg by mouth daily.      . fish oil-omega-3 fatty acids 1000 MG capsule Take 2 g by mouth daily.      Marland Kitchen glucose blood (ONE TOUCH ULTRA TEST) test strip USE ONE STRIP TO CHECK GLUCOSE TWICE DAILY. Dx: E11.9 100 each 11  . levothyroxine (SYNTHROID, LEVOTHROID) 50 MCG tablet Take 1 tablet (50 mcg total) by mouth daily. 90 tablet 3  . lisinopril (PRINIVIL,ZESTRIL) 40 MG tablet Take 1 tablet (40  mg total) by mouth daily. 90 tablet 3  . lovastatin (MEVACOR) 20 MG tablet TAKE 1 TABLET BY MOUTH AT BEDTIME 90 tablet 2  . metoprolol tartrate (LOPRESSOR) 100 MG tablet Take 1 tablet (100 mg total) by mouth every morning AND 0.5 tablets (50 mg total) every evening. 135 tablet 3  . metoprolol tartrate (LOPRESSOR) 100 MG tablet TAKE 1 TABLET BY MOUTH TWICE DAILY 180 tablet 0  . ondansetron (ZOFRAN ODT) 4 MG disintegrating tablet Take 1 tablet (4 mg total) by mouth every 8 (eight) hours as needed for nausea or vomiting. 20 tablet 0  . ONETOUCH DELICA LANCETS 33G MISC USE ONE  TO CHECK GLUCOSE TWICE DAILY. Dx:E11.9 100 each 11  . pantoprazole (PROTONIX) 40 MG tablet TAKE ONE TABLET BY MOUTH ONCE DAILY 90 tablet 0  . sertraline (ZOLOFT) 50 MG tablet Take 1 tablet (50 mg total) by mouth daily. 90 tablet 3  . allopurinol (ZYLOPRIM) 100 MG tablet TAKE 2 TABLETS BY MOUTH ONCE DAILY 180 tablet 0  . pantoprazole (PROTONIX) 40 MG tablet TAKE ONE TABLET BY MOUTH ONCE DAILY 90 tablet 1  . amLODipine (NORVASC) 5 MG tablet  Take 1 tablet (5 mg total) by mouth daily. 90 tablet 3  . hydrochlorothiazide (HYDRODIURIL) 25 MG tablet Take 0.5 tablets (12.5 mg total) by mouth daily. 30 tablet 6   No facility-administered medications prior to visit.     No Known Allergies  Review of Systems  Constitutional: Positive for malaise/fatigue. Negative for chills and fever.  HENT: Negative for congestion and hearing loss.   Eyes: Negative for discharge.  Respiratory: Negative for cough, sputum production and shortness of breath.   Cardiovascular: Negative for chest pain, palpitations and leg swelling.  Gastrointestinal: Positive for diarrhea. Negative for abdominal pain, blood in stool, constipation, heartburn, nausea and vomiting.  Genitourinary: Negative for dysuria, frequency, hematuria and urgency.  Musculoskeletal: Negative for back pain, falls and myalgias.  Skin: Negative for rash.  Neurological: Positive for dizziness. Negative for sensory change, loss of consciousness, weakness and headaches.  Endo/Heme/Allergies: Negative for environmental allergies. Does not bruise/bleed easily.  Psychiatric/Behavioral: Positive for depression, memory loss and substance abuse. Negative for suicidal ideas. The patient is not nervous/anxious and does not have insomnia.        Objective:    Physical Exam  Constitutional: She is oriented to person, place, and time. She appears well-developed and well-nourished.  HENT:  Head: Normocephalic and atraumatic.  Eyes: Conjunctivae and EOM are normal.  Neck: Normal range of motion. Neck supple. No JVD present. Carotid bruit is not present. No thyromegaly present.  Cardiovascular: Normal rate, regular rhythm and normal heart sounds.  No murmur heard. Pulmonary/Chest: Effort normal and breath sounds normal. No respiratory distress. She has no wheezes. She has no rales. She exhibits no tenderness.  Musculoskeletal: She exhibits no edema.  Neurological: She is alert and oriented to  person, place, and time.  Psychiatric: Her speech is slurred. She is slowed. Cognition and memory are impaired. She exhibits a depressed mood.  Nursing note and vitals reviewed.   BP 132/60 (BP Location: Left Arm, Patient Position: Sitting, Cuff Size: Normal)   Pulse (!) 55   Temp 98.6 F (37 C) (Oral)   Resp 16   Wt 121 lb 12.8 oz (55.2 kg)   SpO2 96%   BMI 25.46 kg/m  Wt Readings from Last 3 Encounters:  05/01/17 121 lb 12.8 oz (55.2 kg)  02/08/17 125 lb (56.7 kg)  11/06/16 124 lb 12.8  oz (56.6 kg)   BP Readings from Last 3 Encounters:  05/01/17 132/60  02/08/17 (!) 132/56  11/06/16 (!) 148/50     Immunization History  Administered Date(s) Administered  . Influenza Whole 11/30/2006, 09/30/2007, 10/14/2008, 10/07/2009, 10/03/2010, 10/12/2011  . Influenza, High Dose Seasonal PF 09/30/2015  . Influenza-Unspecified 10/09/2012, 10/03/2013, 10/10/2014  . Pneumococcal Conjugate-13 07/27/2015  . Pneumococcal Polysaccharide-23 10/28/2008    Health Maintenance  Topic Date Due  . TETANUS/TDAP  01/08/1949  . FOOT EXAM  11/05/2012  . OPHTHALMOLOGY EXAM  08/07/2014  . MAMMOGRAM  08/28/2014  . HEMOGLOBIN A1C  08/27/2016  . INFLUENZA VACCINE  08/09/2017  . PNA vac Low Risk Adult  Completed    Lab Results  Component Value Date   WBC 6.0 06/29/2016   HGB 15.9 (H) 06/29/2016   HCT 45.8 06/29/2016   PLT 148 (L) 06/29/2016   GLUCOSE 111 (H) 08/02/2016   CHOL 145 02/11/2016   TRIG 99.0 02/11/2016   HDL 55.60 02/11/2016   LDLDIRECT 97.7 01/19/2010   LDLCALC 70 02/11/2016   ALT 14 06/29/2016   AST 29 06/29/2016   NA 139 08/02/2016   K 4.0 08/02/2016   CL 97 08/02/2016   CREATININE 0.88 08/02/2016   BUN 16 08/02/2016   CO2 25 08/02/2016   TSH 3.170 06/29/2016   HGBA1C 5.9 02/11/2016   MICROALBUR 3.8 (H) 05/08/2014    Lab Results  Component Value Date   TSH 3.170 06/29/2016   Lab Results  Component Value Date   WBC 6.0 06/29/2016   HGB 15.9 (H) 06/29/2016   HCT  45.8 06/29/2016   MCV 93.5 06/29/2016   PLT 148 (L) 06/29/2016   Lab Results  Component Value Date   NA 139 08/02/2016   K 4.0 08/02/2016   CO2 25 08/02/2016   GLUCOSE 111 (H) 08/02/2016   BUN 16 08/02/2016   CREATININE 0.88 08/02/2016   BILITOT 1.6 (H) 06/29/2016   ALKPHOS 109 06/29/2016   AST 29 06/29/2016   ALT 14 06/29/2016   PROT 8.0 06/29/2016   ALBUMIN 4.7 06/29/2016   CALCIUM 9.6 08/02/2016   ANIONGAP 19 (H) 06/29/2016   GFR 60.27 03/13/2016   Lab Results  Component Value Date   CHOL 145 02/11/2016   Lab Results  Component Value Date   HDL 55.60 02/11/2016   Lab Results  Component Value Date   LDLCALC 70 02/11/2016   Lab Results  Component Value Date   TRIG 99.0 02/11/2016   Lab Results  Component Value Date   CHOLHDL 3 02/11/2016   Lab Results  Component Value Date   HGBA1C 5.9 02/11/2016         Assessment & Plan:   Problem List Items Addressed This Visit      Unprioritized   Alcohol abuse - Primary    Check labs Refer to psych for help Daughter instructed to take her to [facility] if she gets worse at home  D/w daughter that she does not need tolive by herself  Orthostatics normal       Relevant Medications   allopurinol (ZYLOPRIM) 100 MG tablet   Other Relevant Orders   Ethanol, Clinical   Ambulatory referral to Psychiatry   Cough    Xray Antihistamine otc       Relevant Orders   DG Chest 2 View   Depression, major, single episode, moderate (HCC)    con't zoloft Refer to psych       Relevant Orders   Ambulatory referral  to Psychiatry   Diabetes type 2, controlled (HCC)    Check labs  con't meds      Essential hypertension    Check labs  bp running low--- hold hctz for 3-4 days to see if that helps her fatigue/ sleepiness Take zoloft at night to see if that may be contributing        GOUT   Relevant Medications   allopurinol (ZYLOPRIM) 100 MG tablet   Hypothyroidism    Check labs  con't synthroid       Other  Visit Diagnoses    Gastroesophageal reflux disease, esophagitis presence not specified       Relevant Medications   pantoprazole (PROTONIX) 40 MG tablet      I have changed Tattianna I. Krahenbuhl's allopurinol and pantoprazole. I am also having her maintain her aspirin, fish oil-omega-3 fatty acids, ONETOUCH DELICA LANCETS 33G, glucose blood, ondansetron, hydrochlorothiazide, pantoprazole, amLODipine, levothyroxine, sertraline, lovastatin, metoprolol tartrate, lisinopril, and metoprolol tartrate.  Meds ordered this encounter  Medications  . allopurinol (ZYLOPRIM) 100 MG tablet    Sig: Take 2 tablets (200 mg total) by mouth daily.    Dispense:  180 tablet    Refill:  0  . pantoprazole (PROTONIX) 40 MG tablet    Sig: Take 1 tablet (40 mg total) by mouth daily.    Dispense:  90 tablet    Refill:  1    CMA served as Neurosurgeon during this visit. History, Physical and Plan performed by medical provider. Documentation and orders reviewed and attested to.  Donato Schultz, DO

## 2017-05-01 NOTE — Assessment & Plan Note (Signed)
Check labs  bp running low--- hold hctz for 3-4 days to see if that helps her fatigue/ sleepiness Take zoloft at night to see if that may be contributing

## 2017-05-01 NOTE — Assessment & Plan Note (Addendum)
Check labs Refer to psych for help Daughter instructed to take her to [facility] if she gets worse at home  D/w daughter that she does not need tolive by herself  Orthostatics normal

## 2017-05-01 NOTE — Assessment & Plan Note (Signed)
con't zoloft Refer to psych

## 2017-05-01 NOTE — Assessment & Plan Note (Signed)
Check labs con't meds 

## 2017-05-01 NOTE — Assessment & Plan Note (Signed)
Check labs con't synthroid 

## 2017-05-01 NOTE — Patient Instructions (Signed)
Dehydration Dehydration is when there is not enough fluid or water in your body. This happens when you lose more fluids than you take in. People who are age 82 or older have a higher risk of getting dehydrated. Dehydration can range from mild to very bad. It should be treated right away to keep it from getting very bad. Symptoms of mild dehydration may include:  Thirst.  Dry lips.  Slightly dry mouth.  Dry, warm skin.  Dizziness. Symptoms of moderate dehydration may include:  Very dry mouth.  Muscle cramps.  Dark pee (urine). Pee may be the color of tea.  Your body making less pee.  Your eyes making fewer tears.  Heartbeat that is uneven or faster than normal (palpitations).  Headache.  Light-headedness, especially when you stand up from sitting.  Fainting (syncope). Symptoms of very bad dehydration may include:  Changes in skin, such as: ? Cold and clammy skin. ? Blotchy (mottled) or pale skin. ? Skin that does not quickly return to normal after being lightly pinched and let go (poor skin turgor).  Changes in body fluids, such as: ? Feeling very thirsty. ? Your eyes making fewer tears. ? Not sweating when body temperature is high, such as in hot weather. ? Your body making very little pee.  Changes in vital signs, such as: ? Weak pulse. ? Pulse that is more than 100 beats a minute when you are sitting still. ? Fast breathing. ? Low blood pressure.  Other changes, such as: ? Sunken eyes. ? Cold hands and feet. ? Confusion. ? Lack of energy (lethargy). ? Trouble waking up from sleep. ? Short-term weight loss. ? Unconsciousness. Follow these instructions at home:  If told by your doctor, drink an ORS: ? Make an ORS by using instructions on the package. ? Start by drinking small amounts, about  cup (120 mL) every 5-10 minutes. ? Slowly drink more until you have had the amount that your doctor said to have.  Drink enough clear fluid to keep your pee  clear or pale yellow. If you were told to drink an ORS, finish the ORS first, then start slowly drinking clear fluids. Drink fluids such as: ? Water. Do not drink only water by itself. Doing that can make the salt (sodium) level in your body get too low (hyponatremia). ? Ice chips. ? Fruit juice that you have added water to (diluted). ? Low-calorie sports drinks.  Avoid: ? Alcohol. ? Drinks that have a lot of sugar. These include high-calorie sports drinks, fruit juice that does not have water added, and soda. ? Caffeine. ? Foods that are greasy or have a lot of fat or sugar.  Take over-the-counter and prescription medicines only as told by your doctor.  Do not take salt tablets. Doing that can make the salt level in your body get too high (hypernatremia).  Eat foods that have minerals (electrolytes). Examples include bananas, oranges, potatoes, tomatoes, and spinach.  Keep all follow-up visits as told by your doctor. This is important. Contact a doctor if:  You have belly (abdominal) pain that: ? Gets worse. ? Stays in one area (localizes).  You have a rash.  You have a stiff neck.  You get angry or annoyed more easily than normal (irritability).  You are more sleepy than normal.  You have a harder time waking up than normal.  You feel: ? Weak. ? Dizzy. ? Very thirsty. Get help right away if:  You have symptoms of very bad dehydration.    You cannot drink fluids without throwing up (vomiting).  Your symptoms get worse with treatment.  You have a fever.  You have a very bad headache.  You are throwing up or having watery poop (diarrhea) and it: ? Gets worse. ? Does not go away.  You have diarrhea for more than 24 hours.  You have blood or something green (bile) in your throw-up.  You have blood in your poop (stool). This may cause poop to look black and tarry.  You have not peed in 6-8 hours.  You have peed (urinated) only a small amount of very dark pee  during 6-8 hours.  You pass out (faint).  Your heart rate when you are sitting still is more than 100 beats a minute.  You have trouble breathing. This information is not intended to replace advice given to you by your health care provider. Make sure you discuss any questions you have with your health care provider. Document Released: 12/15/2010 Document Revised: 07/16/2015 Document Reviewed: 02/19/2015 Elsevier Interactive Patient Education  2018 Elsevier Inc.  

## 2017-05-03 ENCOUNTER — Ambulatory Visit: Payer: Medicare Other | Admitting: Family Medicine

## 2017-05-03 LAB — ETHANOL, CLINICAL
ALCOHOL, ETHYL (B): NOT DETECTED g/dL
ALCOHOL, ETHYL (B): NOT DETECTED mg/dL

## 2017-05-04 ENCOUNTER — Ambulatory Visit (HOSPITAL_BASED_OUTPATIENT_CLINIC_OR_DEPARTMENT_OTHER)
Admission: RE | Admit: 2017-05-04 | Discharge: 2017-05-04 | Disposition: A | Discharge status: Home / Self Care | Payer: Medicare Other | Source: Ambulatory Visit | Attending: Family Medicine | Admitting: Family Medicine

## 2017-05-04 DIAGNOSIS — R05 Cough: Secondary | ICD-10-CM | POA: Diagnosis not present

## 2017-05-04 DIAGNOSIS — R059 Cough, unspecified: Secondary | ICD-10-CM

## 2017-05-04 DIAGNOSIS — R0602 Shortness of breath: Secondary | ICD-10-CM | POA: Diagnosis not present

## 2017-05-04 NOTE — Addendum Note (Signed)
Addended by: Seabron Spates R on: 05/04/2017 04:49 PM   Modules accepted: Orders

## 2017-05-07 ENCOUNTER — Ambulatory Visit: Payer: Medicare Other | Admitting: Family Medicine

## 2017-05-07 ENCOUNTER — Telehealth: Payer: Self-pay | Admitting: *Deleted

## 2017-05-07 ENCOUNTER — Other Ambulatory Visit: Payer: Self-pay | Admitting: Family Medicine

## 2017-05-07 DIAGNOSIS — F1010 Alcohol abuse, uncomplicated: Secondary | ICD-10-CM

## 2017-05-07 DIAGNOSIS — R413 Other amnesia: Secondary | ICD-10-CM

## 2017-05-07 DIAGNOSIS — F321 Major depressive disorder, single episode, moderate: Secondary | ICD-10-CM

## 2017-05-07 DIAGNOSIS — M109 Gout, unspecified: Secondary | ICD-10-CM

## 2017-05-07 DIAGNOSIS — E785 Hyperlipidemia, unspecified: Secondary | ICD-10-CM

## 2017-05-07 NOTE — Telephone Encounter (Signed)
-----   Message from Donato Schultz, DO sent at 05/07/2017  8:43 AM EDT ----- Apologize to her daughter--- the labs were not done  Only alcohol I would like to do them -- if its too far to come here we can order them at [facility].    ----- Message ----- From: Harley Alto Sent: 05/07/2017   7:15 AM To: Donato Schultz, DO  Tests can not be added, Tube drawn was sent to [facility] it is a send out test. ----- Message ----- From: Donato Schultz, DO Sent: 05/04/2017   4:49 PM To: Estanislado Spire, CMA, #  I swore I put a cbc and cmp in for pt when she was here but it s not there--- not sure what happened Any way it can be added to blood they have ? If not if pt can not get here --- if [facility] is closer we can order it there

## 2017-05-08 NOTE — Telephone Encounter (Signed)
Called and spoke with the pt's daughter(Robin) and informed her of the message below.  The daughter verbalized understanding and agreed to bring the pt back in for the future labs.  She stated that our office will be closer for the pt to come and have her labs drawn.  The pt was scheduled a lab appt.for (Wed-05/09/17 @ 9:00 am).//AB/CMA

## 2017-05-08 NOTE — Telephone Encounter (Signed)
Angie will call patient to return

## 2017-05-08 NOTE — Addendum Note (Signed)
Addended by: Verdie Shire on: 05/08/2017 01:01 PM   Modules accepted: Orders

## 2017-05-09 ENCOUNTER — Other Ambulatory Visit (INDEPENDENT_AMBULATORY_CARE_PROVIDER_SITE_OTHER): Payer: Medicare Other

## 2017-05-09 DIAGNOSIS — R413 Other amnesia: Secondary | ICD-10-CM | POA: Diagnosis not present

## 2017-05-09 DIAGNOSIS — R8290 Unspecified abnormal findings in urine: Secondary | ICD-10-CM | POA: Diagnosis not present

## 2017-05-09 DIAGNOSIS — R82998 Other abnormal findings in urine: Secondary | ICD-10-CM

## 2017-05-09 DIAGNOSIS — M109 Gout, unspecified: Secondary | ICD-10-CM

## 2017-05-09 DIAGNOSIS — E785 Hyperlipidemia, unspecified: Secondary | ICD-10-CM | POA: Diagnosis not present

## 2017-05-09 DIAGNOSIS — F1010 Alcohol abuse, uncomplicated: Secondary | ICD-10-CM | POA: Diagnosis not present

## 2017-05-09 DIAGNOSIS — F321 Major depressive disorder, single episode, moderate: Secondary | ICD-10-CM

## 2017-05-09 LAB — COMPREHENSIVE METABOLIC PANEL
ALT: 10 U/L (ref 0–35)
AST: 14 U/L (ref 0–37)
Albumin: 4 g/dL (ref 3.5–5.2)
Alkaline Phosphatase: 56 U/L (ref 39–117)
BUN: 27 mg/dL — ABNORMAL HIGH (ref 6–23)
CALCIUM: 10 mg/dL (ref 8.4–10.5)
CHLORIDE: 103 meq/L (ref 96–112)
CO2: 29 meq/L (ref 19–32)
CREATININE: 1.34 mg/dL — AB (ref 0.40–1.20)
GFR: 39.92 mL/min — AB (ref >60.00)
GLUCOSE: 106 mg/dL — AB (ref 70–99)
Potassium: 5 mEq/L (ref 3.5–5.1)
Sodium: 141 mEq/L (ref 135–145)
Total Bilirubin: 0.5 mg/dL (ref 0.2–1.2)
Total Protein: 6.9 g/dL (ref 6.0–8.3)

## 2017-05-09 LAB — POC URINALSYSI DIPSTICK (AUTOMATED)
BILIRUBIN UA: NEGATIVE
Blood, UA: NEGATIVE
Glucose, UA: NEGATIVE
Ketones, UA: NEGATIVE
NITRITE UA: NEGATIVE
PH UA: 6.5 (ref 5.0–8.0)
Spec Grav, UA: 1.015 (ref 1.010–1.025)
UROBILINOGEN UA: 1 U/dL

## 2017-05-09 LAB — CBC WITH DIFFERENTIAL/PLATELET
BASOS PCT: 0.4 % (ref 0.0–3.0)
Basophils Absolute: 0 10*3/uL (ref 0.0–0.1)
EOS PCT: 0.4 % (ref 0.0–5.0)
Eosinophils Absolute: 0 10*3/uL (ref 0.0–0.7)
HEMATOCRIT: 34.7 % — AB (ref 36.0–46.0)
HEMOGLOBIN: 11.4 g/dL — AB (ref 12.0–15.0)
LYMPHS PCT: 36.5 % (ref 12.0–46.0)
Lymphs Abs: 1.5 10*3/uL (ref 0.7–4.0)
MCHC: 32.9 g/dL (ref 30.0–36.0)
MCV: 96.5 fl (ref 78.0–100.0)
MONOS PCT: 21.6 % — AB (ref 3.0–12.0)
Monocytes Absolute: 0.9 10*3/uL (ref 0.1–1.0)
NEUTROS ABS: 1.7 10*3/uL (ref 1.4–7.7)
Neutrophils Relative %: 41.1 % — ABNORMAL LOW (ref 43.0–77.0)
PLATELETS: 159 10*3/uL (ref 150.0–400.0)
RBC: 3.6 Mil/uL — ABNORMAL LOW (ref 3.87–5.11)
RDW: 15.8 % — AB (ref 11.5–15.5)
WBC: 4 10*3/uL (ref 4.0–10.5)

## 2017-05-09 LAB — LIPID PANEL
CHOL/HDL RATIO: 3
Cholesterol: 139 mg/dL (ref 0–200)
HDL: 54.9 mg/dL (ref >39.00)
LDL CALC: 66 mg/dL (ref 0–99)
NONHDL: 84.19
TRIGLYCERIDES: 92 mg/dL (ref 0.0–149.0)
VLDL: 18.4 mg/dL (ref 0.0–40.0)

## 2017-05-09 LAB — TSH: TSH: 5.17 u[IU]/mL — ABNORMAL HIGH (ref 0.35–4.50)

## 2017-05-09 LAB — VITAMIN B12: Vitamin B-12: 355 pg/mL (ref 211–911)

## 2017-05-09 LAB — URIC ACID: URIC ACID, SERUM: 4.2 mg/dL (ref 2.4–7.0)

## 2017-05-09 NOTE — Addendum Note (Signed)
Addended by: Verdie Shire on: 05/09/2017 03:28 PM   Modules accepted: Orders

## 2017-05-10 LAB — URINE CULTURE
MICRO NUMBER:: 90531567
Result:: NO GROWTH
SPECIMEN QUALITY: ADEQUATE

## 2017-05-11 ENCOUNTER — Telehealth: Payer: Self-pay | Admitting: *Deleted

## 2017-05-11 ENCOUNTER — Encounter: Payer: Self-pay | Admitting: Family Medicine

## 2017-05-11 ENCOUNTER — Other Ambulatory Visit: Payer: Self-pay | Admitting: *Deleted

## 2017-05-11 DIAGNOSIS — D649 Anemia, unspecified: Secondary | ICD-10-CM

## 2017-05-11 DIAGNOSIS — R7989 Other specified abnormal findings of blood chemistry: Secondary | ICD-10-CM

## 2017-05-11 DIAGNOSIS — R799 Abnormal finding of blood chemistry, unspecified: Secondary | ICD-10-CM

## 2017-05-11 NOTE — Telephone Encounter (Signed)
Spoke with daughter Zella Ball about results.  She wanted to know if you want patient to start back on HCTZ or not.  It was not addressed in lab results.    Advised that provider will be back in office on Monday.

## 2017-05-14 NOTE — Telephone Encounter (Signed)
Do not restart hctz---- monitor weights , edema

## 2017-05-15 NOTE — Telephone Encounter (Signed)
Patients daughter notified and verbalized understanding 

## 2017-05-16 ENCOUNTER — Telehealth: Payer: Self-pay | Admitting: *Deleted

## 2017-05-16 DIAGNOSIS — R799 Abnormal finding of blood chemistry, unspecified: Secondary | ICD-10-CM

## 2017-05-16 DIAGNOSIS — R7989 Other specified abnormal findings of blood chemistry: Secondary | ICD-10-CM

## 2017-05-16 NOTE — Telephone Encounter (Signed)
-----   Message from Donato Schultz, DO sent at 05/16/2017 10:12 AM EDT ----- Ask daughter if she has been able to get her to drink more water.  And has she stopped drinking? We could repeat bmp in a few weeks to see if its better with hydration.   ----- Message ----- From: Blima Ledger, CMA Sent: 05/16/2017   8:50 AM To: Donato Schultz, DO  Did you look into the lisinopril issue before I call to let her know?

## 2017-05-16 NOTE — Telephone Encounter (Signed)
Left message for daughter to call back.  

## 2017-05-17 NOTE — Telephone Encounter (Signed)
Glad to hear she is doing better 

## 2017-05-17 NOTE — Telephone Encounter (Signed)
Daughter states that her mom is doing better, she is getting out more and doing things.  She secretly opens patients cabinet where she keeps the vodka and it has not move and have the same amount.  She will be bringing patient in on Monday to repeat bmp.  Patient also has an appt at [facility] on 6/18.

## 2017-05-21 ENCOUNTER — Other Ambulatory Visit: Payer: Medicare Other

## 2017-05-21 ENCOUNTER — Other Ambulatory Visit (INDEPENDENT_AMBULATORY_CARE_PROVIDER_SITE_OTHER): Payer: Medicare Other

## 2017-05-21 DIAGNOSIS — R799 Abnormal finding of blood chemistry, unspecified: Secondary | ICD-10-CM

## 2017-05-21 DIAGNOSIS — R7989 Other specified abnormal findings of blood chemistry: Secondary | ICD-10-CM | POA: Diagnosis not present

## 2017-05-21 LAB — BASIC METABOLIC PANEL
BUN: 17 mg/dL (ref 6–23)
CO2: 29 meq/L (ref 19–32)
Calcium: 9.2 mg/dL (ref 8.4–10.5)
Chloride: 103 mEq/L (ref 96–112)
Creatinine, Ser: 0.91 mg/dL (ref 0.40–1.20)
GFR: 62.39 mL/min (ref >60.00)
Glucose, Bld: 116 mg/dL — ABNORMAL HIGH (ref 70–99)
POTASSIUM: 4.2 meq/L (ref 3.5–5.1)
SODIUM: 140 meq/L (ref 135–145)

## 2017-05-23 ENCOUNTER — Encounter: Payer: Self-pay | Admitting: Family Medicine

## 2017-05-24 ENCOUNTER — Telehealth: Payer: Self-pay | Admitting: Family Medicine

## 2017-05-24 NOTE — Telephone Encounter (Signed)
Copied from CRM [id number]. Topic: General - Other >> May 24, 2017 10:27 AM Gerrianne Scale wrote: Reason for CRM: Lodema Pilot pt daughter calling about lab results  LM on Machine she will be at work until 6

## 2017-05-24 NOTE — Telephone Encounter (Signed)
Notes recorded by Jones Bales, CMA on 05/15/2017 at 10:21 AM EDT Normal results sent to patient's mychart. ------  Notes recorded by Donato Schultz, DO on 05/14/2017 at 1:04 PM EDT Normal-- no uti ------  Notes recorded by Blima Ledger, CMA on 05/11/2017 at 4:19 PM EDT Spoke with daughter Zella Ball, ifob sent, referral made. ------  Notes recorded by Donato Schultz, DO on 05/09/2017 at 7:52 PM EDT Liver function normal Kidney function --- decreased-- refer to nephrology--- Pt is dehydrated--- inc po fluids-- water Pt is also anemic-- may be from kidney function Should check ifob  Recheck 3 months She can take multivitamin with iron  Cbcd, ibc, ferritin,lipid, cmp

## 2017-05-28 ENCOUNTER — Telehealth: Payer: Self-pay | Admitting: *Deleted

## 2017-05-28 NOTE — Telephone Encounter (Signed)
Copied from CRM [id number]. Topic: Quick Communication - Office Called Patient >> May 16, 2017 12:52 PM Blima Ledger, CMA wrote: Reason for CRM: see phone message from 05/16/17 >> May 24, 2017 10:33 AM Gerrianne Scale wrote: Pt Daughter Zella Ball calling back stating that her mother has been drinking water she's doing better she also states that as far as she knows that  Her mother isn't drinking but she will be going to stay with her sister for 3 weeks so she want know how that will turn out  She want lab results also daughter Zella Ball states that if you call leave message on voice mail because she will be at work until 6 so you want be playing phone tag

## 2017-05-28 NOTE — Telephone Encounter (Signed)
See 05/16/17 phone note. 

## 2017-05-29 ENCOUNTER — Other Ambulatory Visit: Payer: Self-pay | Admitting: *Deleted

## 2017-05-29 DIAGNOSIS — I10 Essential (primary) hypertension: Secondary | ICD-10-CM

## 2017-05-29 MED ORDER — METOPROLOL TARTRATE 100 MG PO TABS: 100.0000 mg | ORAL_TABLET | Freq: Two times a day (BID) | ORAL | 1 refills | Status: AC

## 2017-06-05 ENCOUNTER — Telehealth: Payer: Self-pay | Admitting: *Deleted

## 2017-06-05 NOTE — Telephone Encounter (Signed)
Duplicate

## 2017-06-26 DIAGNOSIS — I129 Hypertensive chronic kidney disease with stage 1 through stage 4 chronic kidney disease, or unspecified chronic kidney disease: Secondary | ICD-10-CM | POA: Diagnosis not present

## 2017-06-26 DIAGNOSIS — N182 Chronic kidney disease, stage 2 (mild): Secondary | ICD-10-CM | POA: Diagnosis not present

## 2017-09-21 ENCOUNTER — Other Ambulatory Visit: Payer: Self-pay | Admitting: Family Medicine

## 2017-09-21 DIAGNOSIS — M109 Gout, unspecified: Secondary | ICD-10-CM

## 2017-09-21 DIAGNOSIS — F1010 Alcohol abuse, uncomplicated: Secondary | ICD-10-CM

## 2017-09-25 DIAGNOSIS — N183 Chronic kidney disease, stage 3 (moderate): Secondary | ICD-10-CM | POA: Diagnosis not present

## 2017-09-25 DIAGNOSIS — D631 Anemia in chronic kidney disease: Secondary | ICD-10-CM | POA: Diagnosis not present

## 2017-09-30 ENCOUNTER — Other Ambulatory Visit: Payer: Self-pay | Admitting: Family Medicine

## 2017-09-30 DIAGNOSIS — E785 Hyperlipidemia, unspecified: Secondary | ICD-10-CM

## 2017-10-06 ENCOUNTER — Other Ambulatory Visit: Payer: Self-pay | Admitting: Family Medicine

## 2017-10-06 DIAGNOSIS — K219 Gastro-esophageal reflux disease without esophagitis: Secondary | ICD-10-CM

## 2017-10-25 ENCOUNTER — Other Ambulatory Visit: Payer: Self-pay | Admitting: Family Medicine

## 2017-10-25 DIAGNOSIS — F329 Major depressive disorder, single episode, unspecified: Secondary | ICD-10-CM

## 2017-10-25 DIAGNOSIS — F32A Depression, unspecified: Secondary | ICD-10-CM

## 2017-10-25 MED ORDER — SERTRALINE HCL 50 MG PO TABS: 50.0000 mg | ORAL_TABLET | Freq: Every day | ORAL | 0 refills | Status: AC

## 2017-10-25 NOTE — Telephone Encounter (Signed)
Copied from CRM [id number]. Topic: Quick Communication - Rx Refill/Question >> Oct 25, 2017 10:44 AM Luanna Cole wrote: Medication: sertraline (ZOLOFT) 50 MG tablet [[id number]]  patient is out of town and does not have medication, could this be called into the [facility] pharmacy in [place] [address] [id number]

## 2017-10-25 NOTE — Telephone Encounter (Signed)
Rx sent 

## 2017-11-10 DIAGNOSIS — S199XXA Unspecified injury of neck, initial encounter: Secondary | ICD-10-CM | POA: Diagnosis not present

## 2017-11-10 DIAGNOSIS — E871 Hypo-osmolality and hyponatremia: Secondary | ICD-10-CM | POA: Diagnosis not present

## 2017-11-10 DIAGNOSIS — S0101XA Laceration without foreign body of scalp, initial encounter: Secondary | ICD-10-CM | POA: Diagnosis present

## 2017-11-10 DIAGNOSIS — D62 Acute posthemorrhagic anemia: Secondary | ICD-10-CM | POA: Diagnosis present

## 2017-11-10 DIAGNOSIS — J9811 Atelectasis: Secondary | ICD-10-CM | POA: Diagnosis present

## 2017-11-10 DIAGNOSIS — E876 Hypokalemia: Secondary | ICD-10-CM | POA: Diagnosis present

## 2017-11-10 DIAGNOSIS — H73892 Other specified disorders of tympanic membrane, left ear: Secondary | ICD-10-CM | POA: Diagnosis present

## 2017-11-10 DIAGNOSIS — S062X9A Diffuse traumatic brain injury with loss of consciousness of unspecified duration, initial encounter: Secondary | ICD-10-CM | POA: Diagnosis not present

## 2017-11-10 DIAGNOSIS — W108XXA Fall (on) (from) other stairs and steps, initial encounter: Secondary | ICD-10-CM | POA: Diagnosis not present

## 2017-11-10 DIAGNOSIS — Z515 Encounter for palliative care: Secondary | ICD-10-CM | POA: Diagnosis not present

## 2017-11-10 DIAGNOSIS — S065X0A Traumatic subdural hemorrhage without loss of consciousness, initial encounter: Secondary | ICD-10-CM | POA: Diagnosis not present

## 2017-11-10 DIAGNOSIS — S60511A Abrasion of right hand, initial encounter: Secondary | ICD-10-CM | POA: Diagnosis present

## 2017-11-10 DIAGNOSIS — E785 Hyperlipidemia, unspecified: Secondary | ICD-10-CM | POA: Diagnosis present

## 2017-11-10 DIAGNOSIS — Z452 Encounter for adjustment and management of vascular access device: Secondary | ICD-10-CM | POA: Diagnosis not present

## 2017-11-10 DIAGNOSIS — S0093XA Contusion of unspecified part of head, initial encounter: Secondary | ICD-10-CM | POA: Diagnosis not present

## 2017-11-10 DIAGNOSIS — S60512A Abrasion of left hand, initial encounter: Secondary | ICD-10-CM | POA: Diagnosis present

## 2017-11-10 DIAGNOSIS — R001 Bradycardia, unspecified: Secondary | ICD-10-CM | POA: Diagnosis present

## 2017-11-10 DIAGNOSIS — G4730 Sleep apnea, unspecified: Secondary | ICD-10-CM | POA: Diagnosis present

## 2017-11-10 DIAGNOSIS — S065X9A Traumatic subdural hemorrhage with loss of consciousness of unspecified duration, initial encounter: Secondary | ICD-10-CM | POA: Diagnosis not present

## 2017-11-10 DIAGNOSIS — R4182 Altered mental status, unspecified: Secondary | ICD-10-CM | POA: Diagnosis not present

## 2017-11-10 DIAGNOSIS — G935 Compression of brain: Secondary | ICD-10-CM | POA: Diagnosis present

## 2017-11-10 DIAGNOSIS — Z66 Do not resuscitate: Secondary | ICD-10-CM | POA: Diagnosis not present

## 2017-11-10 DIAGNOSIS — S0990XA Unspecified injury of head, initial encounter: Secondary | ICD-10-CM | POA: Diagnosis not present

## 2017-11-10 DIAGNOSIS — Z4682 Encounter for fitting and adjustment of non-vascular catheter: Secondary | ICD-10-CM | POA: Diagnosis not present

## 2017-11-10 DIAGNOSIS — S02119A Unspecified fracture of occiput, initial encounter for closed fracture: Secondary | ICD-10-CM | POA: Diagnosis not present

## 2017-11-10 DIAGNOSIS — R4020 Unspecified coma: Secondary | ICD-10-CM | POA: Diagnosis not present

## 2017-11-10 DIAGNOSIS — S066X0A Traumatic subarachnoid hemorrhage without loss of consciousness, initial encounter: Secondary | ICD-10-CM | POA: Diagnosis not present

## 2017-11-10 DIAGNOSIS — S3991XA Unspecified injury of abdomen, initial encounter: Secondary | ICD-10-CM | POA: Diagnosis not present

## 2017-11-10 DIAGNOSIS — E872 Acidosis: Secondary | ICD-10-CM | POA: Diagnosis not present

## 2017-11-10 DIAGNOSIS — S2239XA Fracture of one rib, unspecified side, initial encounter for closed fracture: Secondary | ICD-10-CM | POA: Diagnosis present

## 2017-11-10 DIAGNOSIS — S2232XA Fracture of one rib, left side, initial encounter for closed fracture: Secondary | ICD-10-CM | POA: Diagnosis not present

## 2017-11-10 DIAGNOSIS — J9600 Acute respiratory failure, unspecified whether with hypoxia or hypercapnia: Secondary | ICD-10-CM | POA: Diagnosis not present

## 2017-11-10 DIAGNOSIS — R911 Solitary pulmonary nodule: Secondary | ICD-10-CM | POA: Diagnosis present

## 2017-11-10 DIAGNOSIS — S299XXA Unspecified injury of thorax, initial encounter: Secondary | ICD-10-CM | POA: Diagnosis not present

## 2017-11-10 DIAGNOSIS — S0240DA Maxillary fracture, left side, initial encounter for closed fracture: Secondary | ICD-10-CM | POA: Diagnosis not present

## 2017-11-10 DIAGNOSIS — I10 Essential (primary) hypertension: Secondary | ICD-10-CM | POA: Diagnosis present

## 2017-12-09 DEATH — deceased

## 2017-12-11 ENCOUNTER — Telehealth: Payer: Self-pay

## 2017-12-11 NOTE — Telephone Encounter (Signed)
Copied from CRM [id number]. Topic: General - Other >> Dec 11, 2017 10:30 AM Tamela Oddi wrote: Reason for CRM: Patient's daughter called to inform the office that her mother passed away on 2017/11/15

## 2017-12-12 NOTE — Telephone Encounter (Signed)
Dr. Laury Axon is aware

## 2017-12-21 NOTE — Telephone Encounter (Signed)
Card sent to family.

## 2017-12-28 NOTE — Telephone Encounter (Signed)
error 

## 2018-08-02 IMAGING — DX DG CHEST 2V
2 series · 2 of 2 positions shown · non-contrast
Comparison: Chest x-ray dated February 01, 2016.

CLINICAL DATA: Productive cough and shortness of breath for the
past week.

EXAM:
CHEST - 2 VIEW

[chest pa]
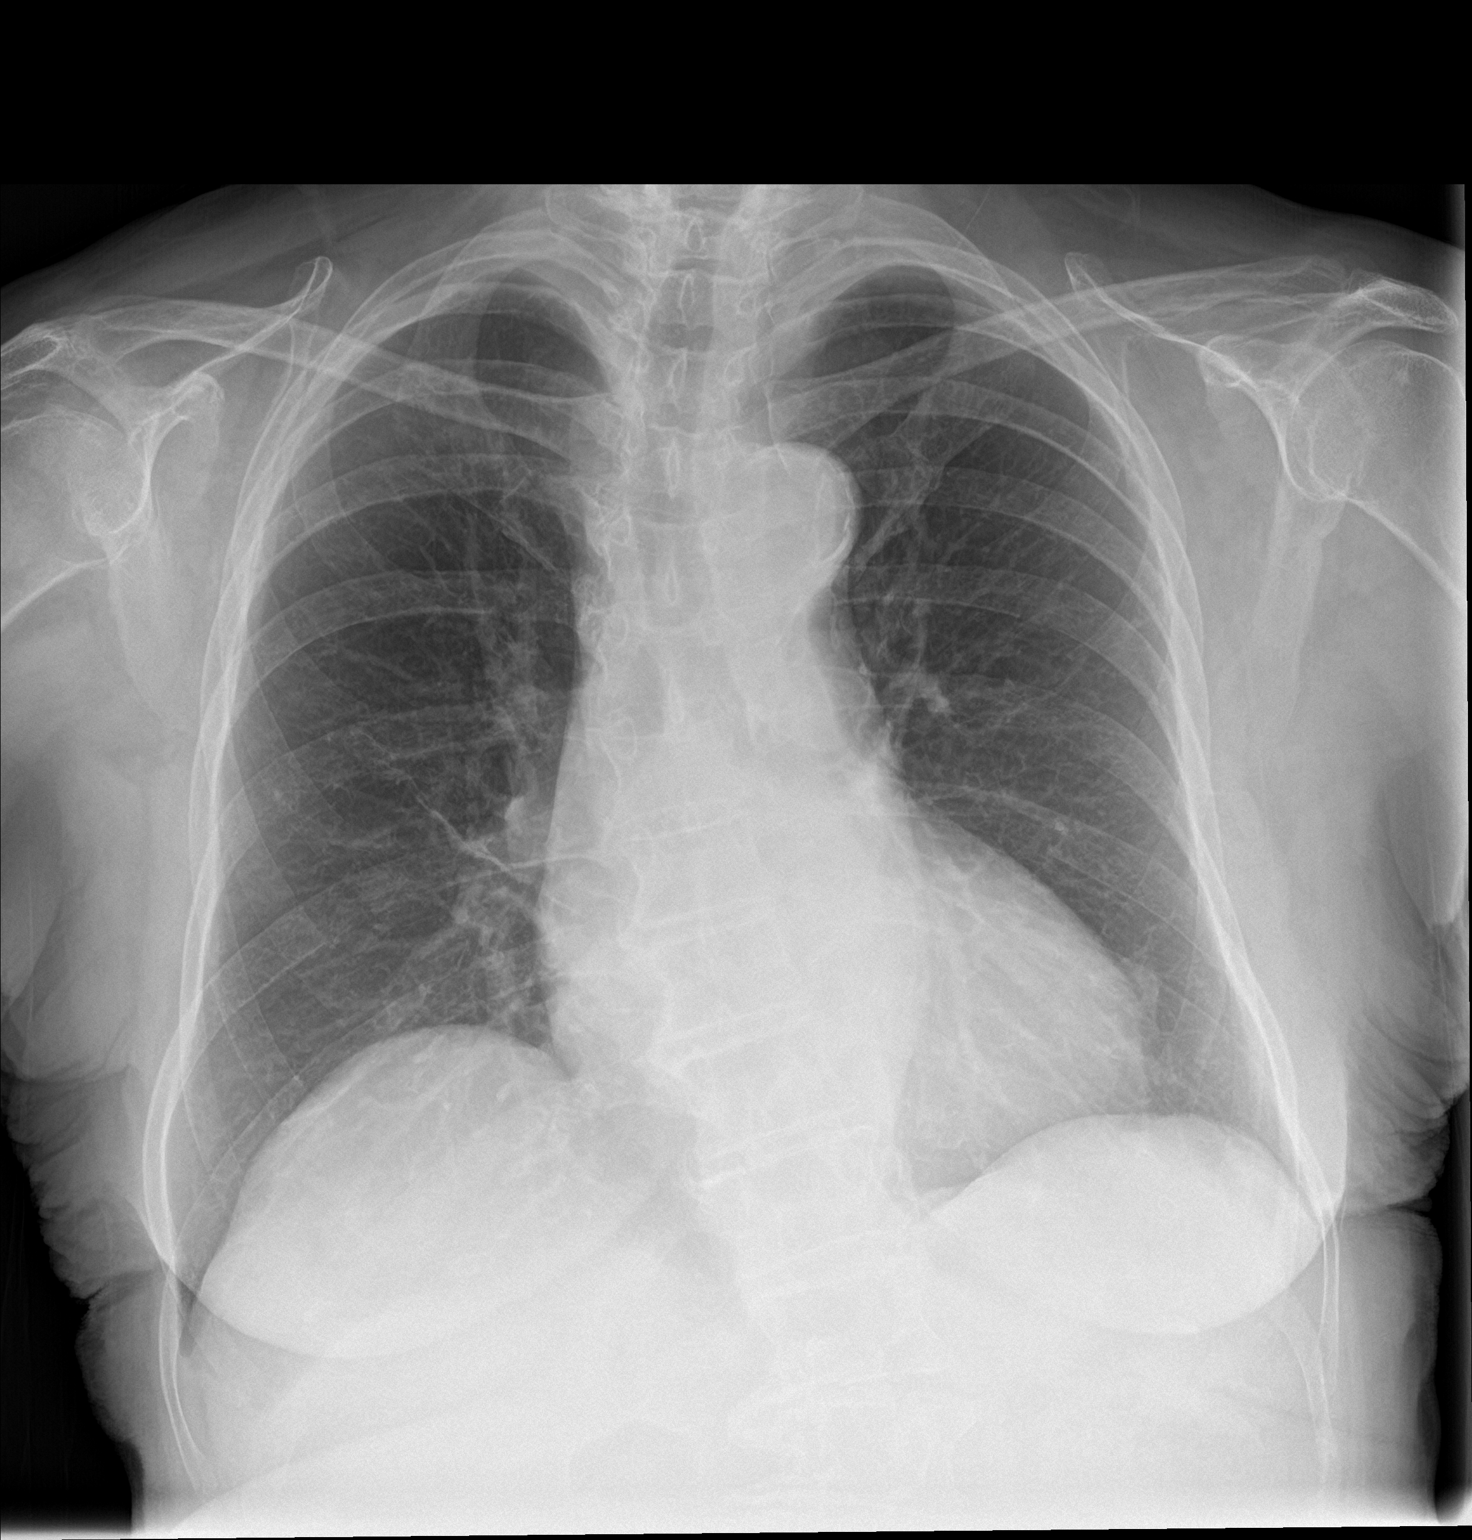

[chest lat]
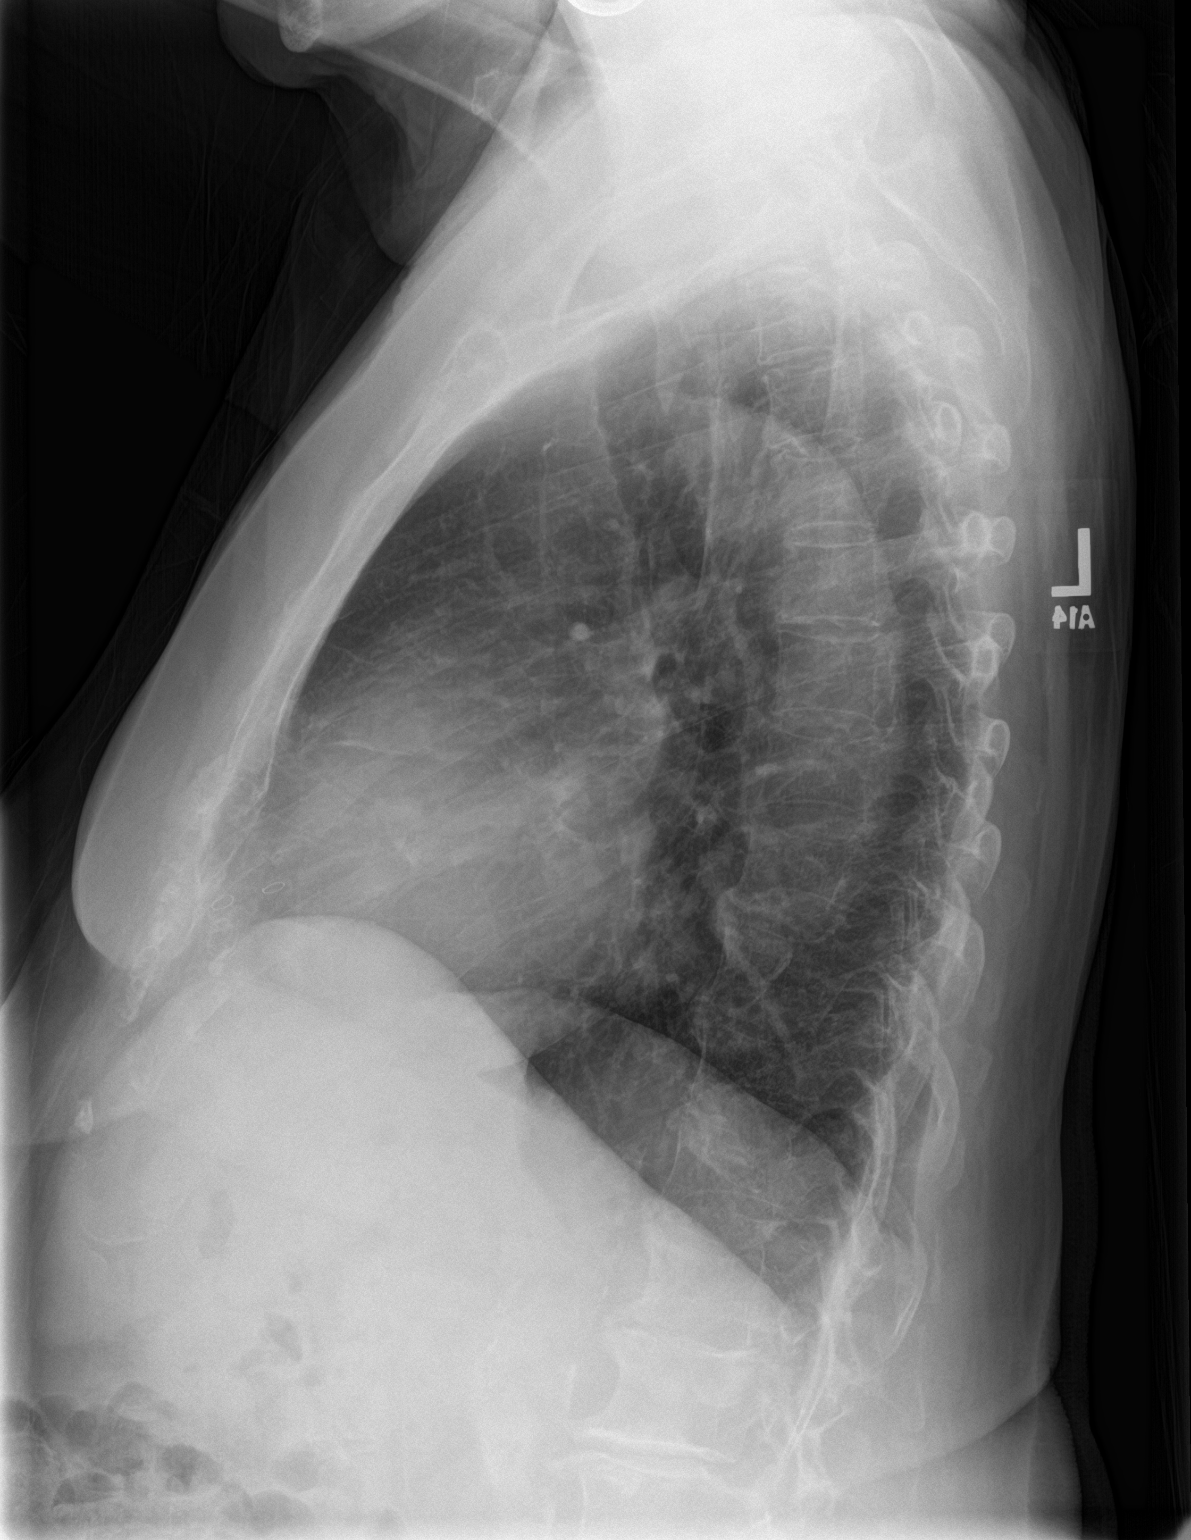

[2 of 2 positions shown; findings below may reference images not displayed]

FINDINGS: The heart size and mediastinal contours are within normal limits.
Normal pulmonary vascularity. Atherosclerotic calcification of the
aortic arch. No focal consolidation, pleural effusion, or
pneumothorax. Unchanged dextroscoliosis of the thoracic spine.
IMPRESSION: No active cardiopulmonary disease.
